# Patient Record
Sex: Female | Born: 1995
Health system: Southern US, Community
[De-identification: ages and names within clinical notes are randomized; demographics above are authoritative.]

## PROBLEM LIST (undated history)

## (undated) DIAGNOSIS — J45909 Unspecified asthma, uncomplicated: Secondary | ICD-10-CM

## (undated) DIAGNOSIS — L309 Dermatitis, unspecified: Secondary | ICD-10-CM

---

## 2014-12-13 ENCOUNTER — Encounter (HOSPITAL_COMMUNITY): Payer: Self-pay | Admitting: Family Medicine

## 2014-12-13 ENCOUNTER — Emergency Department (HOSPITAL_COMMUNITY)
Admission: EM | Admit: 2014-12-13 | Discharge: 2014-12-13 | Disposition: A | Discharge status: Home / Self Care | Payer: Self-pay | Attending: Emergency Medicine | Admitting: Emergency Medicine

## 2014-12-13 DIAGNOSIS — Z3202 Encounter for pregnancy test, result negative: Secondary | ICD-10-CM | POA: Insufficient documentation

## 2014-12-13 DIAGNOSIS — N390 Urinary tract infection, site not specified: Secondary | ICD-10-CM | POA: Insufficient documentation

## 2014-12-13 DIAGNOSIS — Z720 Tobacco use: Secondary | ICD-10-CM | POA: Insufficient documentation

## 2014-12-13 DIAGNOSIS — J45909 Unspecified asthma, uncomplicated: Secondary | ICD-10-CM | POA: Insufficient documentation

## 2014-12-13 HISTORY — DX: Unspecified asthma, uncomplicated: J45.909

## 2014-12-13 LAB — URINALYSIS, ROUTINE W REFLEX MICROSCOPIC
BILIRUBIN URINE: NEGATIVE
Glucose, UA: NEGATIVE mg/dL
Ketones, ur: NEGATIVE mg/dL
NITRITE: POSITIVE — AB
Protein, ur: 100 mg/dL — AB
SPECIFIC GRAVITY, URINE: 1.026 (ref 1.005–1.030)
UROBILINOGEN UA: 0.2 mg/dL (ref 0.0–1.0)
pH: 5.5 (ref 5.0–8.0)

## 2014-12-13 LAB — URINE MICROSCOPIC-ADD ON

## 2014-12-13 LAB — POC URINE PREG, ED: PREG TEST UR: NEGATIVE

## 2014-12-13 MED ORDER — NITROFURANTOIN MONOHYD MACRO 100 MG PO CAPS: 100.0000 mg | ORAL_CAPSULE | Freq: Two times a day (BID) | ORAL | Status: AC

## 2014-12-13 NOTE — ED Provider Notes (Signed)
CSN: [id number]     Arrival date & time 12/13/14  1048 History   First MD Initiated Contact with Patient 12/13/14 1129     Chief Complaint  Patient presents with  . Urinary Tract Infection     (Consider location/radiation/quality/duration/timing/severity/associated sxs/prior Treatment) HPI Comments: Pt is a 19 yo female who presents to the ED with complaint of dysuria, onset 2 days. Pt reports having dysuria and urinary frequency. She states she has noticed a small amount of blood in her urine. Denies fever, chills, HA, SOB, CP, abdominal pain, N/V/D, blood in stool, flank pain, vaginal bleeding, vaginal d/c. LMP 2 weeks ago. Pt reports she is sexually active but does not use any form of contraception. Denies hx of abdominal surgeries.    Past Medical History  Diagnosis Date  . Asthma    History reviewed. No pertinent past surgical history. History reviewed. No pertinent family history. Social History  Substance Use Topics  . Smoking status: Current Every Day Smoker  . Smokeless tobacco: None  . Alcohol Use: No   OB History    No data available     Review of Systems  Genitourinary: Positive for dysuria, frequency and hematuria.  All other systems reviewed and are negative.     Allergies  Review of patient's allergies indicates no known allergies.  Home Medications   Prior to Admission medications   Not on File   BP 141/80 mmHg  Pulse 89  Temp(Src) 98.3 F (36.8 C) (Oral)  Resp 20  SpO2 97%  LMP 12/01/2014 Physical Exam  Constitutional: She is oriented to person, place, and time. She appears well-developed and well-nourished. No distress.  HENT:  Head: Normocephalic and atraumatic.  Eyes: Conjunctivae and EOM are normal. Right eye exhibits no discharge. Left eye exhibits no discharge. No scleral icterus.  Neck: Normal range of motion. Neck supple.  Cardiovascular: Normal rate, regular rhythm, normal heart sounds and intact distal pulses.   Pulmonary/Chest:  Effort normal and breath sounds normal. No respiratory distress. She has no wheezes. She has no rales. She exhibits no tenderness.  Abdominal: Soft. Bowel sounds are normal. She exhibits no distension and no mass. There is no tenderness. There is no rebound, no guarding and no CVA tenderness.  Musculoskeletal: Normal range of motion. She exhibits no edema.  Neurological: She is alert and oriented to person, place, and time.  Skin: Skin is warm and dry.  Nursing note and vitals reviewed.   ED Course  Procedures (including critical care time) Labs Review Labs Reviewed  URINALYSIS, ROUTINE W REFLEX MICROSCOPIC (NOT AT [facility]) - Abnormal; Notable for the following:    APPearance CLOUDY (*)    Hgb urine dipstick LARGE (*)    Protein, ur 100 (*)    Nitrite POSITIVE (*)    Leukocytes, UA MODERATE (*)    All other components within normal limits  URINE MICROSCOPIC-ADD ON - Abnormal; Notable for the following:    Squamous Epithelial / LPF FEW (*)    Bacteria, UA MANY (*)    All other components within normal limits  POC URINE PREG, ED    Imaging Review No results found. I have personally reviewed and evaluated these images and lab results as part of my medical decision-making.  Filed Vitals:   12/13/14 1313  BP:   Pulse:   Temp: 97.9 F (36.6 C)  Resp:      MDM   Final diagnoses:  UTI (lower urinary tract infection)    Pt presents with  dysuria, urinary frequency for past 2 days. Reports noticing small amount of blood in urine. LMP 2 weeks ago. NO abdominal pain, N/V, fever, flank pain, vaginal bleeding, vaginal d/c. VSS. Exam unremarkable. UA consistent with UTI. Urine pregnancy negative. I do not feel that further workup or imaging is warranted at this time, abdominal exam benign. I do not suspect PID, kidney stones, pyelonephritis. Plan to d/c pt home with antibiotic. Pt given resource guide to follow up with PCP.   Evaluation does not show pathology requring ongoing emergent  intervention or admission. Pt is hemodynamically stable and mentating appropriately. Discussed findings/results and plan with patient/guardian, who agrees with plan. All questions answered. Return precautions discussed and outpatient follow up given.      Satira Sarkicole [place], [place]-C 12/13/14 1320  Blane OharaJoshua Zavitz, MD 12/14/14 1600

## 2014-12-13 NOTE — ED Notes (Signed)
Reviewed instructions with pt and departed in NAD.

## 2014-12-13 NOTE — ED Notes (Signed)
Pt here for urinary frequency and pain with urination.

## 2014-12-13 NOTE — Discharge Instructions (Signed)
Take your prescription for Macrobid twice a day for 5 days. Please follow up with a primary care provider from the Resource Guide provided below in 4-5 days. Please return to the Emergency Department if symptoms worsen or new onset of fever, chills, back pain, abdominal pain, vomiting, trouble urinating.   Emergency Department Resource Guide 1) Find a Doctor and Pay Out of Pocket Although you won't have to find out who is covered by your insurance plan, it is a good idea to ask around and get recommendations. You will then need to call the office and see if the doctor you have chosen will accept you as a new patient and what types of options they offer for patients who are self-pay. Some doctors offer discounts or will set up payment plans for their patients who do not have insurance, but you will need to ask so you aren't surprised when you get to your appointment.  2) Contact Your Local Health Department Not all health departments have doctors that can see patients for sick visits, but many do, so it is worth a call to see if yours does. If you don't know where your local health department is, you can check in your phone book. The CDC also has a tool to help you locate your state's health department, and many state websites also have listings of all of their local health departments.  3) Find a Walk-in Clinic If your illness is not likely to be very severe or complicated, you may want to try a walk in clinic. These are popping up all over the country in pharmacies, drugstores, and shopping centers. They're usually staffed by nurse practitioners or physician assistants that have been trained to treat common illnesses and complaints. They're usually fairly quick and inexpensive. However, if you have serious medical issues or chronic medical problems, these are probably not your best option.  No Primary Care Doctor: - Call Health Connect at  [phone] - they can help you locate a primary care doctor  that  accepts your insurance, provides certain services, etc. - Physician Referral Service- [phone]  Chronic Pain Problems: Organization         Address  Phone   Notes  [facility]  [phone] Patients need to be referred by their primary care doctor.   Medication Assistance: Organization         Address  Phone   Notes  [facility] Medication [facility] [address] [place] [place], [place] [address] [phone] --Must be a resident of [facility] -- Must have NO insurance coverage whatsoever (no Medicaid/ Medicare, etc.) -- The pt. MUST have a primary care doctor that directs their care regularly and follows them in the community   MedAssist  [phone]   [employer]  [phone]    Agencies that provide inexpensive medical care: Organization         Address  Phone   Notes  [facility]  [phone]   [facility]    [phone]   [facility] [address] [place], [place] [address] [phone]   [facility] [address] [place]. [address], [place] [phone]   Planned Parenthood    [phone]   [facility]    [phone]   [facility]  [address], [place] Phone:  [phone], Fax:  [phone] Hours of Operation:  9  am - 6 pm, M-F.  Also accepts Medicaid/Medicare and self-pay.  [facility]  [address], [place] Phone: [phone], Fax: [phone]) [phone]. Hours of Operation:  8:30 am - 5:30 pm, M-F.  Also accepts Medicaid and self-pay.  [facility] [place] [address], [place] Phone: [phone]) [phone]   [facility] [address], [place], [place] [phone], Ext. [phone] Mondays & Thursdays: 7-9 AM.  First 15 patients are seen on a first come, first serve basis.    Medicaid-accepting [facility] Providers:  Organization          Address  Phone   Notes  [facility] [address], [place] [phone] Also accepts self-pay patients.  [facility] [address], [place]  [phone]) [phone]   [facility] [address], [place] [phone]   [facility] [address] [place] [address], [place] [phone]) [phone]   Renaye RakersVeita Bland [address], [place]   [phone] Only accepts [place] Access [place] patients after they have their name applied to their card.   Self-Pay (no insurance) in [facility]:  Organization         Address  Phone   Notes  Sickle Cell Patients, [facility] [address], [place] [phone]   [facility] [address], [place] [phone]   [facility] [place]  [address], [place] [phone]   [facility]/Dr. Osei-Bonsu  [address] [place] [address], [place] or [phone], [place] [phone] Phone number for both [place] and [place] locations is the same.  [facility] [address], [place] [phone]) [phone]   [facility] [place] [address] [place] [address], [place] or [address] [phone]) [phone]   [facility] [address], [place] [phone]) [phone], phone; [phone], fax Sees patients 1st and 3rd Saturday of every month.  Must not qualify for public or private insurance (i.e. Medicaid, Medicare, [place] Health Choice, Veterans' Benefits)  Household income should be no more than 200% of the poverty level The clinic cannot treat you if you are pregnant or think you are pregnant  Sexually transmitted diseases are not treated at the clinic.    Dental Care: Organization         Address  Phone  Notes  [facility] Department of [facility] [address], [place] [phone] Accepts children up to age 20 who are enrolled in [place] or [place] Health Choice; pregnant women with a Medicaid card; and children who have applied for Medicaid or [place] Health Choice, but were declined, whose parents can pay a reduced fee at time of service.  [facility] Department of [facility] [place]  [address], [place] [phone]) [phone] Accepts children up to age 20 who are enrolled in [place] or [place] Health Choice; pregnant women with a Medicaid card; and children who have applied for Medicaid or [place] Health Choice, but were declined, whose parents can pay a reduced fee at time of service.  [facility]  [address], [place] [phone] Patients are seen by appointment only. Walk-ins are not accepted. [facility] will see patients 18 years of  age and older. Monday - Tuesday (8am-5pm) Most Wednesdays (8:30-5pm) $30 per visit, cash only  [facility] Adult Dental Access PROGRAM  [address], [facility] [phone] Patients are seen by appointment only. Walk-ins are not accepted. [place] will see patients 57 years of age and older. One Wednesday Evening (Monthly: Volunteer Based).  $30 per visit, cash only  [place]  [phone] for adults; Children under age 78, call Graduate Pediatric Dentistry at [phone]. Children aged 43-14, please call [phone] to request a pediatric application.  Dental services are provided in all areas of dental care including fillings, crowns and bridges, complete and partial dentures, implants, gum treatment, root canals, and extractions. Preventive care is also provided. Treatment is provided to both adults and children. Patients are selected via a lottery and there is often a waiting list.   [facility] [address] [place]  [phone] [email or web address]   [facility] [address] [place], [place]  [phone], Ext. [phone] Second and Fourth Thursday of each month, opens at 6:30 AM; Clinic ends at 9 AM.  Patients are seen on a first-come first-served basis, and a limited number are seen during each clinic.   [facility]  [address] Hillard Danker [place], [place] [phone]   Eligibility Requirements You must have lived in [place], [place], or [place] counties for at least the last three months.   You cannot be eligible for state or federal sponsored [employer], including [employer], [place], or [employer].   You generally cannot be eligible for healthcare insurance through your employer.    How to apply: Eligibility screenings are held every Tuesday and Wednesday afternoon from 1:00 pm until 4:00 pm. You do not need an appointment for the interview!  [facility] [address] [place], [place]   [place] Department  [place]  [phone]    Behavioral Health Resources in the Community: Intensive Outpatient Programs Organization         Address  Phone  Notes  [place]. [address], [place], [place] [phone]   [facility] Outpatient [address], [place], [place]   ADS: Alcohol & Drug Svcs [address] [place], [place]   [place] [address],  [place], [place] or [phone]   Substance Abuse Resources Organization         Address  Phone  Notes  Alcohol and Drug Services  [phone]   [place]  [phone]   The [place]   Chinita Pester  [phone]   Residential & Outpatient Substance Abuse Program  [phone]   Psychological Services Organization         Address  Phone  Notes  [facility] [place]  [place]  [phone]    [facility] [address] [place] or [phone]    Mobile Crisis Teams Organization         Address  Phone  Notes  [facility], Mobile Crisis Care Unit  [phone]   [facility]  [address] [place], [place]   [facility] [address] [place] [phone]    Self-Help/Support Groups Organization  Address  Phone             Notes  [facility] [place] - variety of support groups  [phone]- I7437963 Call for more information  Narcotics Anonymous (NA), Caring [facility] [address], [employer] Progress  2 meetings at this location   Statistician         Address  Phone  Notes  [facility] [address],    [place]  [phone]   [facility]  [address] [place] [address], [place], [place] [phone]   [facility] Treatment Facility [address] [place], [place] [phone] Admissions: 8am-3pm M-F  [facility] [address],    [place], [place] [phone]   [facility] [address] [place], [place], [place] [phone]   [facility] [address],  [place], [place] [phone]   [facility] - Intensive Outpatient [address], [place], [place] [phone]   [facility] ([facility] Recovery [facility] [address] [place].,  [place], [place] [phone] or [phone]   [facility] ([facility]) [address] [place], [place] [phone] Accepts Medicaid  [facility] [place] [address]  [place] [phone] Substance Abuse/Addiction Treatment   [facility] Organization         Address  Phone  Notes  [facility]  [phone]   Angie Fava, PhD [address] [place], [place]   [phone] or [phone]   [facility] Behavioral   [address] [place], [place] [phone]   [facility] [address], [place], [place] [phone] Insurance/Medicaid/sponsorship through [facility] Families [address]                                    [place], [place] [phone] Therapy/tele-psych/case  [facility] [address] [place], [place] [phone]    Dr. Lolly Mustache  [phone]   [facility] [place] [facility]  [employer] [facility] Dept. 1) [address] [place] 2) [address], [place] 3)  [address], [place] [phone]  [phone]   [facility] Child Abuse Hotline [phone] or [phone] (After Hours)

## 2015-07-22 ENCOUNTER — Emergency Department (HOSPITAL_BASED_OUTPATIENT_CLINIC_OR_DEPARTMENT_OTHER)
Admission: EM | Admit: 2015-07-22 | Discharge: 2015-07-22 | Disposition: A | Discharge status: Home / Self Care | Payer: Self-pay | Attending: Emergency Medicine | Admitting: Emergency Medicine

## 2015-07-22 ENCOUNTER — Encounter (HOSPITAL_BASED_OUTPATIENT_CLINIC_OR_DEPARTMENT_OTHER): Payer: Self-pay | Admitting: Emergency Medicine

## 2015-07-22 DIAGNOSIS — J4521 Mild intermittent asthma with (acute) exacerbation: Secondary | ICD-10-CM | POA: Insufficient documentation

## 2015-07-22 DIAGNOSIS — Z87891 Personal history of nicotine dependence: Secondary | ICD-10-CM | POA: Insufficient documentation

## 2015-07-22 MED ORDER — ALBUTEROL SULFATE HFA 108 (90 BASE) MCG/ACT IN AERS
2.0000 | INHALATION_SPRAY | Freq: Once | RESPIRATORY_TRACT | Status: AC
  Administered 2015-07-22: 2 via RESPIRATORY_TRACT
  Filled 2015-07-22: qty 6.7

## 2015-07-22 MED ORDER — DEXAMETHASONE 6 MG PO TABS
10.0000 mg | ORAL_TABLET | Freq: Once | ORAL | Status: AC
  Administered 2015-07-22: 10 mg via ORAL
  Filled 2015-07-22: qty 1

## 2015-07-22 MED ORDER — IPRATROPIUM-ALBUTEROL 0.5-2.5 (3) MG/3ML IN SOLN
3.0000 mL | Freq: Once | RESPIRATORY_TRACT | Status: AC
  Administered 2015-07-22: 3 mL via RESPIRATORY_TRACT

## 2015-07-22 MED ORDER — IPRATROPIUM-ALBUTEROL 0.5-2.5 (3) MG/3ML IN SOLN
RESPIRATORY_TRACT | Status: AC
  Filled 2015-07-22: qty 3

## 2015-07-22 NOTE — Discharge Instructions (Signed)
Asthma Attack Prevention While you may not be able to control the fact that you have asthma, you can take actions to prevent asthma attacks. The best way to prevent asthma attacks is to maintain good control of your asthma. You can achieve this by:  Taking your medicines as directed.  Avoiding things that can irritate your airways or make your asthma symptoms worse (asthma triggers).  Keeping track of how well your asthma is controlled and of any changes in your symptoms.  Responding quickly to worsening asthma symptoms (asthma attack).  Seeking emergency care when it is needed. WHAT ARE SOME WAYS TO PREVENT AN ASTHMA ATTACK? Have a Plan Work with your health care provider to create a written plan for managing and treating your asthma attacks (asthma action plan). This plan includes:  A list of your asthma triggers and how you can avoid them.  Information on when medicines should be taken and when their dosages should be changed.  The use of a device that measures how well your lungs are working (peak flow meter). Monitor Your Asthma Use your peak flow meter and record your results in a journal every day. A drop in your peak flow numbers on one or more days may indicate the start of an asthma attack. This can happen even before you start to feel symptoms. You can prevent an asthma attack from getting worse by following the steps in your asthma action plan. Avoid Asthma Triggers Work with your asthma health care provider to find out what your asthma triggers are. This can be done by:  Allergy testing.  Keeping a journal that notes when asthma attacks occur and the factors that may have contributed to them.  Determining if there are other medical conditions that are making your asthma worse. Once you have determined your asthma triggers, take steps to avoid them. This may include avoiding excessive or prolonged exposure to:  Dust. Have someone dust and vacuum your home for you once or  twice a week. Using a high-efficiency particulate arrestance (HEPA) vacuum is best.  Smoke. This includes campfire smoke, forest fire smoke, and secondhand smoke from tobacco products.  Pet dander. Avoid contact with animals that you know you are allergic to.  Allergens from trees, grasses or pollens. Avoid spending a lot of time outdoors when pollen counts are high, and on very windy days.  Very cold, dry, or humid air.  Mold.  Foods that contain high amounts of sulfites.  Strong odors.  Outdoor air pollutants, such as engine exhaust.  Indoor air pollutants, such as aerosol sprays and fumes from household cleaners.  Household pests, including dust mites and cockroaches, and pest droppings.  Certain medicines, including NSAIDs. Always talk to your health care provider before stopping or starting any new medicines. Medicines Take over-the-counter and prescription medicines only as told by your health care provider. Many asthma attacks can be prevented by carefully following your medicine schedule. Taking your medicines correctly is especially important when you cannot avoid certain asthma triggers. Act Quickly If an asthma attack does happen, acting quickly can decrease how severe it is and how long it lasts. Take these steps:   Pay attention to your symptoms. If you are coughing, wheezing, or having difficulty breathing, do not wait to see if your symptoms go away on their own. Follow your asthma action plan.  If you have followed your asthma action plan and your symptoms are not improving, call your health care provider or seek immediate medical care   at the nearest hospital. It is important to note how often you need to use your fast-acting rescue inhaler. If you are using your rescue inhaler more often, it may mean that your asthma is not under control. Adjusting your asthma treatment plan may help you to prevent future asthma attacks and help you to gain better control of your  condition. HOW CAN I PREVENT AN ASTHMA ATTACK WHEN I EXERCISE? Follow advice from your health care provider about whether you should use your fast-acting inhaler before exercising. Many people with asthma experience exercise-induced bronchoconstriction (EIB). This condition often worsens during vigorous exercise in cold, humid, or dry environments. Usually, people with EIB can stay very active by pre-treating with a fast-acting inhaler before exercising.   This information is not intended to replace advice given to you by your health care provider. Make sure you discuss any questions you have with your health care provider.   Document Released: 01/31/2009 Document Revised: 11/03/2014 Document Reviewed: 07/15/2014 Elsevier Interactive Patient Education 2016 Elsevier Inc.  

## 2015-07-22 NOTE — ED Provider Notes (Signed)
CSN: [id number]     Arrival date & time 07/22/15  1745 History   First MD Initiated Contact with Patient 07/22/15 2001   By signing my name below, I, Mesha Guinyard, attest that this documentation has been prepared under the direction and in the presence of Treatment Team:  Attending Provider: Lyndal Pulleyaniel Seyed Heffley, MD.  Electronically Signed: Arvilla MarketMesha Guinyard, Medical Scribe. 07/22/2015. 8:12 PM.  Chief Complaint  Patient presents with  . Asthma   The history is provided by the patient. No language interpreter was used.    HPI Comments: Lauren Bray is a 20 y.o. female who presents to the Emergency Department complaining of asthma onset an hour ago. Pt recalls she's out of her medication. She states that she is having associated symptoms of tightness in chest, SOB, and wheezing. Pt reports she's never been to the hospital for her asthma. She has PCP. She denies other upper respiratory symptoms, or any sick contacts.  Past Medical History  Diagnosis Date  . Asthma    History reviewed. No pertinent past surgical history. No family history on file. Social History  Substance Use Topics  . Smoking status: Former Smoker    Types: Cigars  . Smokeless tobacco: None  . Alcohol Use: No   OB History    No data available     Review of Systems  Respiratory: Positive for chest tightness, shortness of breath and wheezing.   All other systems reviewed and are negative.  Allergies  Review of patient's allergies indicates no known allergies.  Home Medications   Prior to Admission medications   Not on File   BP 143/93 mmHg  Pulse 74  Temp(Src) 98.2 F (36.8 C) (Oral)  Resp 18  Ht 5\' 3"  (1.6 m)  Wt 220 lb (99.791 kg)  BMI 38.98 kg/m2  SpO2 100%  LMP 07/22/2015 Physical Exam  Constitutional: She is oriented to person, place, and time. She appears well-developed and well-nourished. No distress.  HENT:  Head: Normocephalic and atraumatic.  Eyes: Conjunctivae are normal.  Neck: Neck supple.  No tracheal deviation present.  Cardiovascular: Normal rate, regular rhythm, normal heart sounds and intact distal pulses.  Exam reveals no gallop and no friction rub.   No murmur heard. Pulmonary/Chest: Effort normal and breath sounds normal. No respiratory distress. She has no wheezes. She has no rales.  Clear to auscultation bilaterally  Abdominal: Soft. She exhibits no distension.  Neurological: She is alert and oriented to person, place, and time.  Skin: Skin is warm and dry.  Psychiatric: She has a normal mood and affect. Her behavior is normal.  Nursing note and vitals reviewed.   ED Course  Procedures  DIAGNOSTIC STUDIES: Oxygen Saturation is 100% on RA, NL by my interpretation.    COORDINATION OF CARE: 8:12 PM Discussed treatment plan with pt at bedside and pt agreed to plan.  MDM   Final diagnoses:  Asthma with exacerbation, mild intermittent    Pt with wheezing that started today. Has history of prior reactive airway symptoms. Not septic appearing and no clinical evidence of pneumonia. Provided steroids and breathing optimized with treatments administered by RT. Demonstrated improvement in symptoms and appearance during ED course. Pt breathing comfortably on discharge. Plan to follow up with PCP as needed and return precautions discussed for worsening or new concerning symptoms.    I personally performed the services described in this documentation, which was scribed in my presence. The recorded information has been reviewed and is accurate.  Lyndal Pulley, MD 07/23/15 [phone]

## 2015-07-22 NOTE — ED Notes (Signed)
Pt with wheezing and cough since this morning. Pt is a/o at triage NAD

## 2017-01-03 ENCOUNTER — Other Ambulatory Visit: Payer: Self-pay

## 2017-01-03 ENCOUNTER — Encounter (HOSPITAL_BASED_OUTPATIENT_CLINIC_OR_DEPARTMENT_OTHER): Payer: Self-pay | Admitting: Emergency Medicine

## 2017-01-03 ENCOUNTER — Emergency Department (HOSPITAL_BASED_OUTPATIENT_CLINIC_OR_DEPARTMENT_OTHER)
Admission: EM | Admit: 2017-01-03 | Discharge: 2017-01-03 | Disposition: A | Discharge status: Home / Self Care | Payer: Self-pay | Attending: Emergency Medicine | Admitting: Emergency Medicine

## 2017-01-03 DIAGNOSIS — R21 Rash and other nonspecific skin eruption: Secondary | ICD-10-CM | POA: Insufficient documentation

## 2017-01-03 DIAGNOSIS — J45909 Unspecified asthma, uncomplicated: Secondary | ICD-10-CM | POA: Insufficient documentation

## 2017-01-03 DIAGNOSIS — L309 Dermatitis, unspecified: Secondary | ICD-10-CM | POA: Insufficient documentation

## 2017-01-03 DIAGNOSIS — L0390 Cellulitis, unspecified: Secondary | ICD-10-CM | POA: Insufficient documentation

## 2017-01-03 DIAGNOSIS — Z87891 Personal history of nicotine dependence: Secondary | ICD-10-CM | POA: Insufficient documentation

## 2017-01-03 DIAGNOSIS — L0100 Impetigo, unspecified: Secondary | ICD-10-CM | POA: Insufficient documentation

## 2017-01-03 HISTORY — DX: Dermatitis, unspecified: L30.9

## 2017-01-03 MED ORDER — TRIAMCINOLONE ACETONIDE 0.1 % EX CREA: 1.0000 "application " | TOPICAL_CREAM | Freq: Two times a day (BID) | CUTANEOUS | 0 refills | Status: DC

## 2017-01-03 MED ORDER — CEPHALEXIN 500 MG PO CAPS: 500.0000 mg | ORAL_CAPSULE | Freq: Four times a day (QID) | ORAL | 0 refills | Status: DC

## 2017-01-03 MED FILL — CEPHALEXIN 500 MG CAPSULE: 500 | 7 days supply | Qty: 28 | Fill #0

## 2017-01-03 MED FILL — TRIAMCINOLONE 0.1% CREAM: 0.1 | 30 days supply | Qty: 80 | Fill #0

## 2017-01-03 NOTE — Discharge Instructions (Signed)
Take antibiotics as directed. Please take all of your antibiotics until finished.  As we discussed, you need to take Benadryl and Zantac for the next 7 days.  If you do not want to take Benadryl you can also take Zyrtec.  This will help with itching.  You need to follow-up with your dermatologist.  Call and arrange for an appointment.  As we discussed, return to the emergency department for any worsening rash, drainage from the rash, fevers, difficulty breathing, swelling of her lips or tongue or any other worsening or concerning symptoms.

## 2017-01-03 NOTE — ED Provider Notes (Signed)
[facility] EMERGENCY DEPARTMENT Provider Note   CSN: [id number] Arrival date & time: 01/03/17  [id number]     History   Chief Complaint Chief Complaint  Patient presents with  . Urticaria    HPI Lauren Bray is a 21 y.o. female with PMH/o Asthma and Eczema who presents with generalized rash x 2 weeks.  Patient has a history of eczema and reports that she usually uses triamcinolone cream prescribed by her dermatologist.  Patient reports that approximately 2 and half weeks ago, she ran out of her triamcinolone cream and is not been able to follow-up with her dermatologist.  Patient reports that since then, the rash has become worse.  She reports that it is pruritic.  She reports that she has had some areas that have become very scaly and flaky.  She denies any drainage from those areas.  States that this is similar to previous episodes of eczema flare but states it is slightly worse than she has had previously.  Patient has been using her normal soaps and emollients that she uses with minimal improvement.  Patient called her dermatologist yesterday but was not able to get an appointment prompting ED visit today.  Patient denies any new soaps, detergents, perfumes, new drugs.  Patient denies any fevers, difficulty breathing, oral angioedema.  The history is provided by the patient.    Past Medical History:  Diagnosis Date  . Asthma   . Eczema     There are no active problems to display for this patient.   History reviewed. No pertinent surgical history.  OB History    No data available       Home Medications    Prior to Admission medications   Medication Sig Start Date End Date Taking? Authorizing Provider  cephALEXin (KEFLEX) 500 MG capsule Take 1 capsule (500 mg total) 4 (four) times daily by mouth. 01/03/17   Maxwell CaulLayden, Micholas Drumwright A, PA-C  triamcinolone cream (KENALOG) 0.1 % Apply 1 application 2 (two) times daily topically. 01/03/17   Maxwell CaulLayden, Madlyn Crosby A, PA-C    Family  History No family history on file.  Social History Social History   Tobacco Use  . Smoking status: Former Smoker    Types: Cigars  . Smokeless tobacco: Never Used  Substance Use Topics  . Alcohol use: No  . Drug use: No     Allergies   Patient has no known allergies.   Review of Systems Review of Systems  Constitutional: Negative for fever.  HENT: Negative for trouble swallowing.   Skin: Positive for rash.     Physical Exam Updated Vital Signs BP 133/84   Pulse 93   Temp 98.2 F (36.8 C) (Oral)   Resp 16   Ht 5\' 3"  (1.6 m)   Wt 104.3 kg (230 lb)   LMP 12/31/2016   SpO2 100%   BMI 40.74 kg/m   Physical Exam  Constitutional: She appears well-developed and well-nourished.  Sitting comfortably on examination table  HENT:  Head: Normocephalic and atraumatic.  No oral angioedema.  No rash noted to mucous membranes.  Oral lesions.  Eyes: Conjunctivae and EOM are normal. Right eye exhibits no discharge. Left eye exhibits no discharge. No scleral icterus.  Pulmonary/Chest: Effort normal. She has wheezes.  Very minimal wheezing to upper lung fields.  Neurological: She is alert.  Skin: Skin is warm and dry. Capillary refill takes less than 2 seconds. Rash noted.  Diffuse, scaly, erythematous rash noted to generalized body, worse in the  flexor creases of bilateral upper extremities and bilateral lower extremities.  She also has an area of diffuse scaly erythema across the anterior aspect of her chest.  There is some slight surrounding warmth and redness.  Scattered excoriations noted to bilateral upper and lower extremities.  No rash noted to palms or soles of feet.  Psychiatric: She has a normal mood and affect. Her speech is normal and behavior is normal.  Nursing note and vitals reviewed.    ED Treatments / Results  Labs (all labs ordered are listed, but only abnormal results are displayed) Labs Reviewed - No data to display  EKG  EKG Interpretation None         Radiology No results found.  Procedures Procedures (including critical care time)  Medications Ordered in ED Medications - No data to display   Initial Impression / Assessment and Plan / ED Course  I have reviewed the triage vital signs and the nursing notes.  Pertinent labs & imaging results that were available during my care of the patient were reviewed by me and considered in my medical decision making (see chart for details).     21 year old female who presents with 2 weeks of progressive worsening rash.  History of eczema and uses triamcinolone cream for symptoms.  Currently ran out of triamcinolone cream and has not been able to follow-up with dermatology.  No fevers, chills, oral angioedema. Patient is afebrile, non-toxic appearing, sitting comfortably on examination table. Vital signs reviewed and stable.  History/physical exam is consistent with generalized eczema.  There is a questionable area on the anterior chest that has some excoriations with overlying erythema and warmth.  Question if this is cellulitis versus impetigo.  Do not see any pustules or any crusted areas.  Plan to refill triamcinolone cream for symptomatic relief.  Check the patient on conservative therapies for pruritic relief.  We will also start patient on antibiotic to cover for potential cellulitis versus impetigo.  Instructed patient to follow-up with her dermatologist in the next 24-48 hours for further evaluation. Strict return precautions discussed. Patient expresses understanding and agreement to plan.    Final Clinical Impressions(s) / ED Diagnoses   Final diagnoses:  Eczema, unspecified type  Impetigo  Cellulitis, unspecified cellulitis site    ED Discharge Orders        Ordered    triamcinolone cream (KENALOG) 0.1 %  2 times daily     01/03/17 1030    cephALEXin (KEFLEX) 500 MG capsule  4 times daily     01/03/17 1030       Maxwell CaulLayden, Bradyn Vassey A, PA-C 01/03/17 1056    Alvira MondaySchlossman, Erin,  MD 01/05/17 [phone]

## 2017-01-03 NOTE — ED Triage Notes (Signed)
Pt c/o itching and dry skin d/t eczema; has been out of rx cream for months

## 2017-03-21 ENCOUNTER — Encounter (HOSPITAL_BASED_OUTPATIENT_CLINIC_OR_DEPARTMENT_OTHER): Payer: Self-pay | Admitting: *Deleted

## 2017-03-21 ENCOUNTER — Emergency Department (HOSPITAL_BASED_OUTPATIENT_CLINIC_OR_DEPARTMENT_OTHER)
Admission: EM | Admit: 2017-03-21 | Discharge: 2017-03-21 | Disposition: A | Discharge status: Home / Self Care | Payer: Self-pay | Attending: Emergency Medicine | Admitting: Emergency Medicine

## 2017-03-21 ENCOUNTER — Other Ambulatory Visit: Payer: Self-pay

## 2017-03-21 DIAGNOSIS — B379 Candidiasis, unspecified: Secondary | ICD-10-CM | POA: Insufficient documentation

## 2017-03-21 DIAGNOSIS — L309 Dermatitis, unspecified: Secondary | ICD-10-CM | POA: Insufficient documentation

## 2017-03-21 DIAGNOSIS — Z87891 Personal history of nicotine dependence: Secondary | ICD-10-CM | POA: Insufficient documentation

## 2017-03-21 DIAGNOSIS — Z79899 Other long term (current) drug therapy: Secondary | ICD-10-CM | POA: Insufficient documentation

## 2017-03-21 DIAGNOSIS — J45909 Unspecified asthma, uncomplicated: Secondary | ICD-10-CM | POA: Insufficient documentation

## 2017-03-21 MED ORDER — NYSTATIN 100000 UNIT/GM EX CREA: 1.0000 "application " | TOPICAL_CREAM | Freq: Two times a day (BID) | CUTANEOUS | 0 refills | Status: DC

## 2017-03-21 MED ORDER — PREDNISONE 20 MG PO TABS: 40.0000 mg | ORAL_TABLET | Freq: Every day | ORAL | 0 refills | Status: DC

## 2017-03-21 MED ORDER — TRIAMCINOLONE 0.1 % CREAM:EUCERIN CREAM 1:1: 1.0000 "application " | TOPICAL_CREAM | Freq: Two times a day (BID) | CUTANEOUS | 2 refills | Status: DC

## 2017-03-21 NOTE — ED Triage Notes (Signed)
Rash. States she has eczema and ran out of cream.

## 2017-03-21 NOTE — ED Provider Notes (Signed)
[facility] EMERGENCY DEPARTMENT Provider Note   CSN: [id number] Arrival date & time: 03/21/17  1732     History   Chief Complaint Chief Complaint  Patient presents with  . Rash    HPI Lauren Bray is a 22 y.o. female.  The history is provided by the patient.  Rash   This is a chronic problem. The current episode started 2 days ago. The problem has been rapidly worsening. Associated with: ran out of her triamcinolone cream  There has been no fever. Affected Location: diffuse except for face. The pain is at a severity of 0/10. The pain has been constant since onset. Associated symptoms include itching. She has tried nothing for the symptoms. The treatment provided no relief.    Past Medical History:  Diagnosis Date  . Asthma   . Eczema     There are no active problems to display for this patient.   History reviewed. No pertinent surgical history.  OB History    No data available       Home Medications    Prior to Admission medications   Medication Sig Start Date End Date Taking? Authorizing Provider  ALBUTEROL IN Inhale into the lungs.   Yes [provider]  cephALEXin (KEFLEX) 500 MG capsule Take 1 capsule (500 mg total) 4 (four) times daily by mouth. 01/03/17   Maxwell Caul, PA-C  triamcinolone cream (KENALOG) 0.1 % Apply 1 application 2 (two) times daily topically. 01/03/17   Maxwell Caul, PA-C    Family History No family history on file.  Social History Social History   Tobacco Use  . Smoking status: Former Smoker    Types: Cigars  . Smokeless tobacco: Never Used  Substance Use Topics  . Alcohol use: No  . Drug use: No     Allergies   Patient has no known allergies.   Review of Systems Review of Systems  Skin: Positive for itching and rash.  All other systems reviewed and are negative.    Physical Exam Updated Vital Signs BP 140/89 (BP Location: Right Arm)   Pulse 87   Temp 98.7 F (37.1 C) (Oral)    Resp 20   Ht 5\' 3"  (1.6 m)   Wt 104.3 kg (230 lb)   LMP 03/07/2017   SpO2 100%   BMI 40.74 kg/m   Physical Exam  Constitutional: She appears well-developed and well-nourished. No distress.  HENT:  Head: Normocephalic and atraumatic.  Eyes: Pupils are equal, round, and reactive to light.  Cardiovascular: Normal rate.  Pulmonary/Chest: Effort normal.  Skin: Rash noted.  Patchy raised skin lesions with excoriation and pigment changes diffusely over the arms, trunk and legs consistent with eczema.  No crusting, drainage or evidence of secondary infection at this time. Separate rash present under bilateral breasts with foul odor consistent with yeast skin is darkened and thickened in these areas.  Skin is moist.  Psychiatric: She has a normal mood and affect. Her behavior is normal.  Nursing note and vitals reviewed.    ED Treatments / Results  Labs (all labs ordered are listed, but only abnormal results are displayed) Labs Reviewed - No data to display  EKG  EKG Interpretation None       Radiology No results found.  Procedures Procedures (including critical care time)  Medications Ordered in ED Medications - No data to display   Initial Impression / Assessment and Plan / ED Course  I have reviewed the triage vital  signs and the nursing notes.  Pertinent labs & imaging results that were available during my care of the patient were reviewed by me and considered in my medical decision making (see chart for details).    Patient presenting with evidence of eczema exacerbation over her entire body most likely related to her running out of her triamcinolone cream 1 week ago.  There is no evidence of secondary infection.  Also patient has a candidal infection under her breasts.  Patient given triamcinolone and Eucerin cream as well as nystatin to go under her breasts.  Final Clinical Impressions(s) / ED Diagnoses   Final diagnoses:  Eczema, unspecified type  Candida  infection    ED Discharge Orders        Ordered    Triamcinolone Acetonide (TRIAMCINOLONE 0.1 % CREAM : EUCERIN) CREA  2 times daily     03/21/17 1952    nystatin cream (MYCOSTATIN)  2 times daily     03/21/17 1952    predniSONE (DELTASONE) 20 MG tablet  Daily     03/21/17 1952       Gwyneth SproutPlunkett, Chesni Vos, MD 03/21/17 1953

## 2017-08-13 ENCOUNTER — Encounter (HOSPITAL_BASED_OUTPATIENT_CLINIC_OR_DEPARTMENT_OTHER): Payer: Self-pay | Admitting: *Deleted

## 2017-08-13 ENCOUNTER — Emergency Department (HOSPITAL_BASED_OUTPATIENT_CLINIC_OR_DEPARTMENT_OTHER)
Admission: EM | Admit: 2017-08-13 | Discharge: 2017-08-13 | Disposition: A | Discharge status: Home / Self Care | Payer: Self-pay | Attending: Emergency Medicine | Admitting: Emergency Medicine

## 2017-08-13 ENCOUNTER — Emergency Department (HOSPITAL_BASED_OUTPATIENT_CLINIC_OR_DEPARTMENT_OTHER): Payer: Self-pay

## 2017-08-13 ENCOUNTER — Other Ambulatory Visit: Payer: Self-pay

## 2017-08-13 DIAGNOSIS — R05 Cough: Secondary | ICD-10-CM | POA: Insufficient documentation

## 2017-08-13 DIAGNOSIS — R072 Precordial pain: Secondary | ICD-10-CM | POA: Insufficient documentation

## 2017-08-13 DIAGNOSIS — J45909 Unspecified asthma, uncomplicated: Secondary | ICD-10-CM | POA: Insufficient documentation

## 2017-08-13 DIAGNOSIS — F17290 Nicotine dependence, other tobacco product, uncomplicated: Secondary | ICD-10-CM | POA: Insufficient documentation

## 2017-08-13 MED ORDER — IBUPROFEN 600 MG PO TABS: 600.0000 mg | ORAL_TABLET | Freq: Three times a day (TID) | ORAL | 0 refills | Status: AC | PRN

## 2017-08-13 MED ORDER — IBUPROFEN 200 MG PO TABS
600.0000 mg | ORAL_TABLET | Freq: Once | ORAL | Status: AC
  Administered 2017-08-13: 600 mg via ORAL
  Filled 2017-08-13: qty 1

## 2017-08-13 NOTE — ED Provider Notes (Signed)
[facility] EMERGENCY DEPARTMENT Provider Note   CSN: [id number] Arrival date & time: 08/13/17  [id number]     History   Chief Complaint Chief Complaint  Patient presents with  . Chest Pain    HPI Lauren Bray is a 22 y.o. female.  HPI Patient is a 22 year old female presents the emergency department with sharp left-sided chest pain over the past 3 days.  Initially was intermittent and now it is constant.  It is present with deep breathing.  It initially began after an episode of coughing.  She denies shortness of breath.  No history of DVT or pulmonary embolism.  She reports she did try some Tums last night with some improvement in her symptoms.  No history of gastroesophageal reflux disease.  She does have a history of asthma but states this feels different.  No shortness of breath.   Past Medical History:  Diagnosis Date  . Asthma   . Eczema     There are no active problems to display for this patient.   History reviewed. No pertinent surgical history.   OB History   None      Home Medications    Prior to Admission medications   Medication Sig Start Date End Date Taking? Authorizing Provider  ALBUTEROL IN Inhale into the lungs.   Yes [provider]  Triamcinolone Acetonide (TRIAMCINOLONE 0.1 % CREAM : EUCERIN) CREA Apply 1 application topically 2 (two) times daily. 03/21/17  Yes Plunkett, Alphonzo LemmingsWhitney, MD  cephALEXin (KEFLEX) 500 MG capsule Take 1 capsule (500 mg total) 4 (four) times daily by mouth. 01/03/17   Maxwell CaulLayden, Lindsey A, PA-C  nystatin cream (MYCOSTATIN) Apply 1 application topically 2 (two) times daily. 03/21/17   Gwyneth SproutPlunkett, Whitney, MD  predniSONE (DELTASONE) 20 MG tablet Take 2 tablets (40 mg total) by mouth daily. 03/21/17   Gwyneth SproutPlunkett, Whitney, MD  triamcinolone cream (KENALOG) 0.1 % Apply 1 application 2 (two) times daily topically. 01/03/17   Maxwell CaulLayden, Lindsey A, PA-C    Family History History reviewed. No pertinent family history.  Social  History Social History   Tobacco Use  . Smoking status: Current Every Day Smoker    Packs/day: 1.00    Types: Cigars  . Smokeless tobacco: Never Used  Substance Use Topics  . Alcohol use: No  . Drug use: No     Allergies   Orange juice [orange oil]   Review of Systems Review of Systems  All other systems reviewed and are negative.    Physical Exam Updated Vital Signs BP 127/81   Pulse 70   Temp 98.7 F (37.1 C) (Oral)   Resp 16   Ht 5\' 3"  (1.6 m)   Wt 92.1 kg (203 lb)   LMP 07/20/2017   SpO2 99%   BMI 35.96 kg/m   Physical Exam  Constitutional: She is oriented to person, place, and time. She appears well-developed and well-nourished. No distress.  HENT:  Head: Normocephalic and atraumatic.  Eyes: EOM are normal.  Neck: Normal range of motion.  Cardiovascular: Normal rate, regular rhythm and normal heart sounds.  Pulmonary/Chest: Effort normal and breath sounds normal.  Abdominal: Soft. She exhibits no distension. There is no tenderness.  Musculoskeletal: Normal range of motion.  Neurological: She is alert and oriented to person, place, and time.  Skin: Skin is warm and dry.  Psychiatric: She has a normal mood and affect. Judgment normal.  Nursing note and vitals reviewed.    ED Treatments / Results  Labs (all  labs ordered are listed, but only abnormal results are displayed) Labs Reviewed - No data to display  EKG EKG Interpretation  Date/Time:  Tuesday August 13 2017 09:37:34 EDT Ventricular Rate:  73 PR Interval:    QRS Duration: 91 QT Interval:  388 QTC Calculation: 428 R Axis:   59 Text Interpretation:  Sinus rhythm No old tracing to compare Confirmed by Azalia Bilis ([id number]) on 08/13/2017 9:42:41 AM   Radiology Dg Chest 2 View  Result Date: 08/13/2017 CLINICAL DATA:  Left upper chest pain EXAM: CHEST - 2 VIEW COMPARISON:  None. FINDINGS: Heart and mediastinal contours are within normal limits. No focal opacities or effusions. No acute bony  abnormality. IMPRESSION: No active cardiopulmonary disease. Electronically Signed   By: Charlett Nose M.D.   On: 08/13/2017 10:31    Procedures Procedures (including critical care time)  Medications Ordered in ED Medications - No data to display   Initial Impression / Assessment and Plan / ED Course  I have reviewed the triage vital signs and the nursing notes.  Pertinent labs & imaging results that were available during my care of the patient were reviewed by me and considered in my medical decision making (see chart for details).     Nonspecific chest pain.  May be musculoskeletal nature.  EKG without ischemic changes.  Chest x-ray pending  10:37 AM Overall well-appearing.  Chest x-ray without abnormality.  Patient be discharged home with instructions for anti-inflammatory treatment.  Primary care follow-up.  Patient understands return to the ER for new or worsening symptoms   Final Clinical Impressions(s) / ED Diagnoses   Final diagnoses:  None    ED Discharge Orders    None       Azalia Bilis, MD 08/13/17 1038

## 2017-08-13 NOTE — ED Triage Notes (Signed)
Chest pain started 3-4 days ago.  Mom gave Tums yesterday at 1230, chest pain went away and in the middle of the night it came back.

## 2017-08-13 NOTE — ED Notes (Signed)
ED Provider at bedside. 

## 2017-09-22 ENCOUNTER — Emergency Department (HOSPITAL_BASED_OUTPATIENT_CLINIC_OR_DEPARTMENT_OTHER)
Admission: EM | Admit: 2017-09-22 | Discharge: 2017-09-22 | Disposition: A | Discharge status: Home / Self Care | Payer: Medicaid Other | Attending: Emergency Medicine | Admitting: Emergency Medicine

## 2017-09-22 ENCOUNTER — Other Ambulatory Visit: Payer: Self-pay

## 2017-09-22 ENCOUNTER — Encounter (HOSPITAL_BASED_OUTPATIENT_CLINIC_OR_DEPARTMENT_OTHER): Payer: Self-pay | Admitting: *Deleted

## 2017-09-22 DIAGNOSIS — Z79899 Other long term (current) drug therapy: Secondary | ICD-10-CM | POA: Insufficient documentation

## 2017-09-22 DIAGNOSIS — K029 Dental caries, unspecified: Secondary | ICD-10-CM | POA: Insufficient documentation

## 2017-09-22 DIAGNOSIS — J45909 Unspecified asthma, uncomplicated: Secondary | ICD-10-CM | POA: Insufficient documentation

## 2017-09-22 DIAGNOSIS — F17290 Nicotine dependence, other tobacco product, uncomplicated: Secondary | ICD-10-CM | POA: Insufficient documentation

## 2017-09-22 DIAGNOSIS — K0889 Other specified disorders of teeth and supporting structures: Secondary | ICD-10-CM

## 2017-09-22 MED ORDER — KETOROLAC TROMETHAMINE 30 MG/ML IJ SOLN
30.0000 mg | Freq: Once | INTRAMUSCULAR | Status: AC
  Administered 2017-09-22: 30 mg via INTRAMUSCULAR
  Filled 2017-09-22: qty 1

## 2017-09-22 MED ORDER — NAPROXEN 500 MG PO TABS: 500.0000 mg | ORAL_TABLET | Freq: Two times a day (BID) | ORAL | 0 refills | Status: AC

## 2017-09-22 MED ORDER — PENICILLIN V POTASSIUM 500 MG PO TABS: 500.0000 mg | ORAL_TABLET | Freq: Four times a day (QID) | ORAL | 0 refills | Status: AC

## 2017-09-22 NOTE — ED Triage Notes (Signed)
Right upper tooth pain x 3-4 days

## 2017-09-22 NOTE — ED Provider Notes (Signed)
[facility] EMERGENCY DEPARTMENT Provider Note   CSN: [id number] Arrival date & time: 09/22/17  0040     History   Chief Complaint Chief Complaint  Patient presents with  . Dental Pain    HPI Lauren Bray is a 22 y.o. female.  HPI  This is a 22 year old female with a history of asthma who presents with tooth pain.  Patient reports 3 to 4-day history of worsening right upper dental pain.  She rates her pain 8 out of 10.  She has taken tramadol and Tylenol at home with minimal relief.  She denies any fevers, difficulty swallowing.  She does not have a dentist.  She denies any recent dental work.  Pain is worse with chewing.  It is throbbing in nature.  Past Medical History:  Diagnosis Date  . Asthma   . Eczema     There are no active problems to display for this patient.   History reviewed. No pertinent surgical history.   OB History   None      Home Medications    Prior to Admission medications   Medication Sig Start Date End Date Taking? Authorizing Provider  ALBUTEROL IN Inhale into the lungs.    [provider]  ibuprofen (ADVIL,MOTRIN) 600 MG tablet Take 1 tablet (600 mg total) by mouth every 8 (eight) hours as needed. 08/13/17   Azalia Bilisampos, Kevin, MD  naproxen (NAPROSYN) 500 MG tablet Take 1 tablet (500 mg total) by mouth 2 (two) times daily. 09/22/17   Horton, Mayer Maskerourtney F, MD  penicillin v potassium (VEETID) 500 MG tablet Take 1 tablet (500 mg total) by mouth 4 (four) times daily for 7 days. 09/22/17 09/29/17  Horton, Mayer Maskerourtney F, MD  Triamcinolone Acetonide (TRIAMCINOLONE 0.1 % CREAM : EUCERIN) CREA Apply 1 application topically 2 (two) times daily. 03/21/17   Gwyneth SproutPlunkett, Whitney, MD    Family History No family history on file.  Social History Social History   Tobacco Use  . Smoking status: Current Every Day Smoker    Packs/day: 1.00    Types: Cigars  . Smokeless tobacco: Never Used  Substance Use Topics  . Alcohol use: No  . Drug use: No       Allergies   Orange juice [orange oil]   Review of Systems Review of Systems  Constitutional: Negative for fever.  HENT: Positive for dental problem. Negative for trouble swallowing.   All other systems reviewed and are negative.    Physical Exam Updated Vital Signs BP (!) 171/108 (BP Location: Left Arm)   Pulse 76   Temp 98.4 F (36.9 C) (Oral)   Resp 16   LMP 09/08/2017   SpO2 100%   Physical Exam  Constitutional: She is oriented to person, place, and time. She appears well-developed and well-nourished.  Obese, no acute distress  HENT:  Head: Normocephalic and atraumatic.  Mouth/Throat: Uvula is midline, oropharynx is clear and moist and mucous membranes are normal. No oral lesions. No trismus in the jaw. Dental caries present. No dental abscesses. No oropharyngeal exudate.    Eyes: Pupils are equal, round, and reactive to light.  Neck: Neck supple.  Cardiovascular: Normal rate and regular rhythm.  Pulmonary/Chest: Effort normal. No respiratory distress.  Neurological: She is alert and oriented to person, place, and time.  Skin: Skin is warm and dry.  Psychiatric: She has a normal mood and affect.  Nursing note and vitals reviewed.    ED Treatments / Results  Labs (all labs ordered  are listed, but only abnormal results are displayed) Labs Reviewed - No data to display  EKG None  Radiology No results found.  Procedures Procedures (including critical care time)  Medications Ordered in ED Medications  ketorolac (TORADOL) 30 MG/ML injection 30 mg (has no administration in time range)     Initial Impression / Assessment and Plan / ED Course  I have reviewed the triage vital signs and the nursing notes.  Pertinent labs & imaging results that were available during my care of the patient were reviewed by me and considered in my medical decision making (see chart for details).     Patient presents with right upper dental pain.  She is overall  nontoxic-appearing on exam.  Vital signs notable for blood pressure 131/1 8.  Otherwise within normal limits.  No evidence of abscess.  Suspect underlying infection.  Not really amenable to dental block.  Recommend scheduled naproxen and penicillin.  Patient was provided with dental resources.  No evidence of deep space infection on exam.  After history, exam, and medical workup I feel the patient has been appropriately medically screened and is safe for discharge home. Pertinent diagnoses were discussed with the patient. Patient was given return precautions.   Final Clinical Impressions(s) / ED Diagnoses   Final diagnoses:  Pain, dental    ED Discharge Orders        Ordered    penicillin v potassium (VEETID) 500 MG tablet  4 times daily     09/22/17 0237    naproxen (NAPROSYN) 500 MG tablet  2 times daily     09/22/17 0237       Horton, Mayer Masker, MD 09/22/17 [phone]

## 2017-12-28 ENCOUNTER — Other Ambulatory Visit: Payer: Self-pay

## 2017-12-28 ENCOUNTER — Encounter (HOSPITAL_BASED_OUTPATIENT_CLINIC_OR_DEPARTMENT_OTHER): Payer: Self-pay | Admitting: Emergency Medicine

## 2017-12-28 ENCOUNTER — Emergency Department (HOSPITAL_BASED_OUTPATIENT_CLINIC_OR_DEPARTMENT_OTHER)
Admission: EM | Admit: 2017-12-28 | Discharge: 2017-12-28 | Disposition: A | Discharge status: Home / Self Care | Payer: Medicaid Other | Attending: Emergency Medicine | Admitting: Emergency Medicine

## 2017-12-28 DIAGNOSIS — J45909 Unspecified asthma, uncomplicated: Secondary | ICD-10-CM | POA: Insufficient documentation

## 2017-12-28 DIAGNOSIS — Z79899 Other long term (current) drug therapy: Secondary | ICD-10-CM | POA: Insufficient documentation

## 2017-12-28 DIAGNOSIS — F17210 Nicotine dependence, cigarettes, uncomplicated: Secondary | ICD-10-CM | POA: Insufficient documentation

## 2017-12-28 DIAGNOSIS — L309 Dermatitis, unspecified: Secondary | ICD-10-CM

## 2017-12-28 DIAGNOSIS — L259 Unspecified contact dermatitis, unspecified cause: Secondary | ICD-10-CM | POA: Insufficient documentation

## 2017-12-28 MED ORDER — TRIAMCINOLONE ACETONIDE 0.1 % EX CREA: 1.0000 "application " | TOPICAL_CREAM | Freq: Two times a day (BID) | CUTANEOUS | 0 refills | Status: DC

## 2017-12-28 NOTE — Discharge Instructions (Addendum)
It was our pleasure to provide your ER care today - we hope that you feel better.  Avoid any perfumed soaps/detergents.   Use eczema-type moisturizer such as Aveeno, CeraVe, or Eucerin.   You may try zyrtec or claritin as need for itching (available over the counter).   You may also use triamcinolone cream as need.   Follow up with primary care doctor in 1-2 weeks.

## 2017-12-28 NOTE — ED Provider Notes (Signed)
[facility] EMERGENCY DEPARTMENT Provider Note   CSN: [id number] Arrival date & time: 12/28/17  1027     History   Chief Complaint Chief Complaint  Patient presents with  . Rash    HPI Lauren Bray is a 22 y.o. female.  Patient indicates in past 1-2 weeks that her eczema has flared up, especially on upper extremities and back. Hx same. Symptoms gradual onset, persistent, constant, slowly worse. States out of triamcinolone cream and requests rx. Patient denies any new medication use. Denies any change in other home or personal products. Does not feel sick or ill. No fever or chills.   The history is provided by the patient.  Rash      Past Medical History:  Diagnosis Date  . Asthma   . Eczema     There are no active problems to display for this patient.   History reviewed. No pertinent surgical history.   OB History   None      Home Medications    Prior to Admission medications   Medication Sig Start Date End Date Taking? Authorizing Provider  ALBUTEROL IN Inhale into the lungs.    [provider]  ibuprofen (ADVIL,MOTRIN) 600 MG tablet Take 1 tablet (600 mg total) by mouth every 8 (eight) hours as needed. 08/13/17   Azalia Bilis, MD  naproxen (NAPROSYN) 500 MG tablet Take 1 tablet (500 mg total) by mouth 2 (two) times daily. 09/22/17   Horton, Mayer Masker, MD  Triamcinolone Acetonide (TRIAMCINOLONE 0.1 % CREAM : EUCERIN) CREA Apply 1 application topically 2 (two) times daily. 03/21/17   Gwyneth Sprout, MD    Family History No family history on file.  Social History Social History   Tobacco Use  . Smoking status: Current Every Day Smoker    Packs/day: 1.00    Types: Cigars  . Smokeless tobacco: Never Used  Substance Use Topics  . Alcohol use: No  . Drug use: No     Allergies   Orange juice [orange oil]   Review of Systems Review of Systems  Constitutional: Negative for fever.  HENT: Negative for sore throat.     Respiratory: Negative for cough.   Skin: Positive for rash.     Physical Exam Updated Vital Signs BP 131/76 (BP Location: Left Arm)   Pulse 89   Temp 98.2 F (36.8 C) (Oral)   Resp 16   Ht 1.6 m (5\' 3" )   Wt 97.5 kg   LMP 12/18/2017   SpO2 100%   BMI 38.09 kg/m   Physical Exam  Constitutional: She appears well-developed and well-nourished.  HENT:  Mouth/Throat: Oropharynx is clear and moist.  Eyes: Conjunctivae are normal. No scleral icterus.  Neck: Neck supple. No tracheal deviation present.  Cardiovascular: Normal rate, regular rhythm, normal heart sounds and intact distal pulses.  Pulmonary/Chest: Effort normal and breath sounds normal. No respiratory distress.  Abdominal: Normal appearance.  Musculoskeletal: She exhibits no edema.  Neurological: She is alert.  Skin: Skin is warm and dry.  Dermatitis c/w eczema, esp on elbows, upper back, arms.   Psychiatric: She has a normal mood and affect.  Nursing note and vitals reviewed.    ED Treatments / Results  Labs (all labs ordered are listed, but only abnormal results are displayed) Labs Reviewed - No data to display  EKG None  Radiology No results found.  Procedures Procedures (including critical care time)  Medications Ordered in ED Medications - No data to display  Initial Impression / Assessment and Plan / ED Course  I have reviewed the triage vital signs and the nursing notes.  Pertinent labs & imaging results that were available during my care of the patient were reviewed by me and considered in my medical decision making (see chart for details).  Pt requests new rx.   Reviewed nursing notes and prior charts for additional history.   Also rec claritin or zyrtec for itching, moisturizer therapy.  rec pcp f/u.  Pt appears stable for d/c.     Final Clinical Impressions(s) / ED Diagnoses   Final diagnoses:  None    ED Discharge Orders    None       Cathren Laine, MD 12/28/17  1103

## 2017-12-28 NOTE — ED Triage Notes (Signed)
Pt c/o "eczema flare" x 1 week.

## 2018-01-09 ENCOUNTER — Encounter (HOSPITAL_BASED_OUTPATIENT_CLINIC_OR_DEPARTMENT_OTHER): Payer: Self-pay | Admitting: *Deleted

## 2018-01-09 ENCOUNTER — Emergency Department (HOSPITAL_BASED_OUTPATIENT_CLINIC_OR_DEPARTMENT_OTHER)
Admission: EM | Admit: 2018-01-09 | Discharge: 2018-01-09 | Disposition: A | Discharge status: Home / Self Care | Payer: Medicaid Other | Attending: Emergency Medicine | Admitting: Emergency Medicine

## 2018-01-09 DIAGNOSIS — J45909 Unspecified asthma, uncomplicated: Secondary | ICD-10-CM | POA: Insufficient documentation

## 2018-01-09 DIAGNOSIS — L309 Dermatitis, unspecified: Secondary | ICD-10-CM | POA: Insufficient documentation

## 2018-01-09 DIAGNOSIS — Z79899 Other long term (current) drug therapy: Secondary | ICD-10-CM | POA: Insufficient documentation

## 2018-01-09 DIAGNOSIS — F17290 Nicotine dependence, other tobacco product, uncomplicated: Secondary | ICD-10-CM | POA: Insufficient documentation

## 2018-01-09 MED ORDER — TRIAMCINOLONE ACETONIDE 0.1 % EX CREA: 1.0000 "application " | TOPICAL_CREAM | Freq: Two times a day (BID) | CUTANEOUS | 0 refills | Status: DC

## 2018-01-09 MED FILL — TRIAMCINOLONE 0.1% CREAM: 0.1 | 30 days supply | Qty: 454 | Fill #0

## 2018-01-09 NOTE — Discharge Instructions (Signed)
Apply Triamcinolone to the body. Only use Hydrocortisone 1% on the face

## 2018-01-09 NOTE — ED Notes (Signed)
Patient states her rash is due to running out of triamcinolone cream.  Requesting large jar of this.

## 2018-01-09 NOTE — ED Provider Notes (Signed)
[facility] EMERGENCY DEPARTMENT Provider Note   CSN: [id number] Arrival date & time: 01/09/18  1149     History   Chief Complaint Chief Complaint  Patient presents with  . Rash    HPI Lauren Bray is a 22 y.o. female who presents for medication refill.  Past medical history for eczema and asthma.  She states that she was seen here couple weeks ago and was given 2 small tubes of triamcinolone.  She states that this was not enough because her eczema is over her entire body.  It is very itchy and she has been scratching the point where she is creating wounds.  She does not have insurance right now and recently lost her job.  She does not see a dermatologist regularly.  No fevers.  HPI  Past Medical History:  Diagnosis Date  . Asthma   . Eczema     There are no active problems to display for this patient.   History reviewed. No pertinent surgical history.   OB History   None      Home Medications    Prior to Admission medications   Medication Sig Start Date End Date Taking? Authorizing Provider  ALBUTEROL IN Inhale into the lungs.    [provider]  ibuprofen (ADVIL,MOTRIN) 600 MG tablet Take 1 tablet (600 mg total) by mouth every 8 (eight) hours as needed. 08/13/17   Azalia Bilisampos, Kevin, MD  naproxen (NAPROSYN) 500 MG tablet Take 1 tablet (500 mg total) by mouth 2 (two) times daily. 09/22/17   Horton, Mayer Maskerourtney F, MD  Triamcinolone Acetonide (TRIAMCINOLONE 0.1 % CREAM : EUCERIN) CREA Apply 1 application topically 2 (two) times daily. 03/21/17   Gwyneth SproutPlunkett, Whitney, MD  triamcinolone cream (KENALOG) 0.1 % Apply 1 application topically 2 (two) times daily. 12/28/17   Cathren LaineSteinl, Kevin, MD    Family History No family history on file.  Social History Social History   Tobacco Use  . Smoking status: Current Every Day Smoker    Packs/day: 1.00    Types: Cigars  . Smokeless tobacco: Never Used  Substance Use Topics  . Alcohol use: No  . Drug use: No      Allergies   Orange juice [orange oil]   Review of Systems Review of Systems  Constitutional: Negative for fever.  Skin: Positive for rash.     Physical Exam Updated Vital Signs BP 127/82 (BP Location: Left Arm)   Pulse 88   Temp 98.1 F (36.7 C) (Oral)   Resp 16   Ht 5\' 3"  (1.6 m)   Wt 97.5 kg   LMP 12/18/2017   SpO2 100%   BMI 38.08 kg/m   Physical Exam  Constitutional: She is oriented to person, place, and time. She appears well-developed and well-nourished. No distress.  HENT:  Head: Normocephalic and atraumatic.  Dry perioral rash  Eyes: Pupils are equal, round, and reactive to light. Conjunctivae are normal. Right eye exhibits no discharge. Left eye exhibits no discharge. No scleral icterus.  Neck: Normal range of motion.  Cardiovascular: Normal rate.  Pulmonary/Chest: Effort normal. No respiratory distress.  Abdominal: She exhibits no distension.  Neurological: She is alert and oriented to person, place, and time.  Skin: Skin is warm and dry. Rash (Erythematous dry generalized rash on the trunk and extremities) noted.  Psychiatric: She has a normal mood and affect. Her behavior is normal.  Nursing note and vitals reviewed.    ED Treatments / Results  Labs (all labs ordered  are listed, but only abnormal results are displayed) Labs Reviewed - No data to display  EKG None  Radiology No results found.  Procedures Procedures (including critical care time)  Medications Ordered in ED Medications - No data to display   Initial Impression / Assessment and Plan / ED Course  I have reviewed the triage vital signs and the nursing notes.  Pertinent labs & imaging results that were available during my care of the patient were reviewed by me and considered in my medical decision making (see chart for details).  22 year old female with eczema presents for medication refill.  We will prescribe a jar of triamcinolone.  She was advised to only use  hydrocortisone on her face.  She is encouraged to follow-up with a dermatologist when possible.  Final Clinical Impressions(s) / ED Diagnoses   Final diagnoses:  Eczema, unspecified type    ED Discharge Orders    None       Bethel Born, PA-C 01/09/18 1313    Tilden Fossa, MD 01/10/18 [phone]

## 2018-01-09 NOTE — ED Triage Notes (Signed)
Eczema exacerbation. She ran out of her Triamcinolone cream.

## 2018-04-20 ENCOUNTER — Other Ambulatory Visit: Payer: Self-pay

## 2018-04-20 ENCOUNTER — Encounter (HOSPITAL_BASED_OUTPATIENT_CLINIC_OR_DEPARTMENT_OTHER): Payer: Self-pay | Admitting: Emergency Medicine

## 2018-04-20 ENCOUNTER — Emergency Department (HOSPITAL_BASED_OUTPATIENT_CLINIC_OR_DEPARTMENT_OTHER)
Admission: EM | Admit: 2018-04-20 | Discharge: 2018-04-20 | Disposition: A | Discharge status: Home / Self Care | Payer: Medicaid Other | Attending: Emergency Medicine | Admitting: Emergency Medicine

## 2018-04-20 DIAGNOSIS — J45909 Unspecified asthma, uncomplicated: Secondary | ICD-10-CM | POA: Insufficient documentation

## 2018-04-20 DIAGNOSIS — Z79899 Other long term (current) drug therapy: Secondary | ICD-10-CM | POA: Insufficient documentation

## 2018-04-20 DIAGNOSIS — F17210 Nicotine dependence, cigarettes, uncomplicated: Secondary | ICD-10-CM | POA: Insufficient documentation

## 2018-04-20 DIAGNOSIS — H00012 Hordeolum externum right lower eyelid: Secondary | ICD-10-CM | POA: Insufficient documentation

## 2018-04-20 MED ORDER — TETRACAINE HCL 0.5 % OP SOLN
1.0000 [drp] | Freq: Once | OPHTHALMIC | Status: AC
  Administered 2018-04-20: 1 [drp] via OPHTHALMIC
  Filled 2018-04-20: qty 4

## 2018-04-20 MED ORDER — ERYTHROMYCIN 5 MG/GM OP OINT: TOPICAL_OINTMENT | OPHTHALMIC | 0 refills | Status: DC

## 2018-04-20 MED ORDER — FLUORESCEIN SODIUM 1 MG OP STRP
1.0000 | ORAL_STRIP | Freq: Once | OPHTHALMIC | Status: AC
  Administered 2018-04-20: 1 via OPHTHALMIC
  Filled 2018-04-20: qty 1

## 2018-04-20 NOTE — ED Triage Notes (Signed)
Patient states that she has had intermittent facial swelling to her ber bilateral eyes for the last 2 -3 days - she also reports that she is having a rash. Patient denies any known allergies

## 2018-04-20 NOTE — Discharge Instructions (Signed)
Tinea applying warm compresses.  Use antibiotic ointment as directed.  Follow-up with referred eye doctor if symptoms continue.  Return the emergency department for any worsening eyelid redness, swelling, fevers or any other worsening concerning symptoms.

## 2018-04-20 NOTE — ED Provider Notes (Signed)
[facility] EMERGENC[facility] DEPARTMENT Provider Note   CSN: [id number] Arrival date & time: 04/20/18  1349    History   Chief Complaint Chief Complaint  Patient presents with  . Eye Drainage    HPI Lauren Bray is a 23 y.o. female past medical 3 of asthma, eczema who presents for evaluation of 3 days of bilateral eye redness, irritation, eyelid swelling.  Patient also reports she has had styes.  He is initially she had a stye in the left eye tooth that drained yesterday.  She reports that when she is here developed a stye in the right eye.  She reports that they are irritating.  She states that she has not tried any medications.  She has applied teabags and warm compresses.  She has not had any vision changes.  She does not wear glasses or contacts.  She denies any trauma.  Patient states she has not had any fevers.  Patient denies any history of allergies.    The history is provided by the patient.    Past Medical History:  Diagnosis Date  . Asthma   . Eczema     There are no active problems to display for this patient.   History reviewed. No pertinent surgical history.   OB History   No obstetric history on file.      Home Medications    Prior to Admission medications   Medication Sig Start Date End Date Taking? Authorizing Provider  ALBUTEROL IN Inhale into the lungs.    [provider]  erythromycin ophthalmic ointment Place a 1/2 inch ribbon of ointment into the lower eyelid x4 days for 1 week. 04/20/18   Maxwell Caul, PA-C  ibuprofen (ADVIL,MOTRIN) 600 MG tablet Take 1 tablet (600 mg total) by mouth every 8 (eight) hours as needed. 08/13/17   Azalia Bilis, MD  naproxen (NAPROSYN) 500 MG tablet Take 1 tablet (500 mg total) by mouth 2 (two) times daily. 09/22/17   Horton, Mayer Masker, MD  Triamcinolone Acetonide (TRIAMCINOLONE 0.1 % CREAM : EUCERIN) CREA Apply 1 application topically 2 (two) times daily. 03/21/17   Gwyneth Sprout, MD    triamcinolone cream (KENALOG) 0.1 % Apply 1 application topically 2 (two) times daily. 01/09/18   Bethel Born, PA-C    Family History History reviewed. No pertinent family history.  Social History Social History   Tobacco Use  . Smoking status: Current Every Day Smoker    Packs/day: 1.00    Types: Cigars  . Smokeless tobacco: Never Used  Substance Use Topics  . Alcohol use: No  . Drug use: No     Allergies   Orange juice [orange oil]   Review of Systems Review of Systems  Constitutional: Negative for fever.  Eyes: Positive for pain, discharge and redness. Negative for visual disturbance.  All other systems reviewed and are negative.    Physical Exam Updated Vital Signs BP 140/89 (BP Location: Left Arm)   Pulse 76   Temp 98.1 F (36.7 C) (Oral)   Resp 18   Wt 97.5 kg   LMP 04/06/2018   SpO2 100%   BMI 38.08 kg/m   Physical Exam Vitals signs and nursing note reviewed.  Constitutional:      Appearance: She is well-developed.  HENT:     Head: Normocephalic and atraumatic.  Eyes:     General: No scleral icterus.       Right eye: Discharge and hordeolum present.  Left eye: No discharge.     Extraocular Movements: Extraocular movements intact.     Right eye: Normal extraocular motion.     Left eye: Normal extraocular motion.     Conjunctiva/sclera:     Right eye: Right conjunctiva is injected.     Comments: Slight conjunctival injection noted on the right eye.  There is a stye noted on the right lower eyelid.  She is got diffuse erythema edema noted to bilateral eyelids with also some scaly skin changes noted on the eyelids.  No surrounding periorbital edema, erythema.  EOMs intact without any difficulty. PERRL.   Pulmonary:     Effort: Pulmonary effort is normal.  Skin:    General: Skin is warm and dry.  Neurological:     Mental Status: She is alert.  Psychiatric:        Speech: Speech normal.        Behavior: Behavior normal.      ED  Treatments / Results  Labs (all labs ordered are listed, but only abnormal results are displayed) Labs Reviewed - No data to display  EKG None  Radiology No results found.  Procedures Procedures (including critical care time)  Medications Ordered in ED Medications  tetracaine (PONTOCAINE) 0.5 % ophthalmic solution 1 drop (1 drop Both Eyes Given by Other 04/20/18 1604)  fluorescein ophthalmic strip 1 strip (1 strip Both Eyes Given by Other 04/20/18 1604)     Initial Impression / Assessment and Plan / ED Course  I have reviewed the triage vital signs and the nursing notes.  Pertinent labs & imaging results that were available during my care of the patient were reviewed by me and considered in my medical decision making (see chart for details).        23 year old female who presents for evaluation of 3 days of bilateral eye irritation.  She reports that for last week, she has had styes.  First in the left eye and then that switched to the right eye.  She states that the eyelids are irritating.  No fevers. Patient is afebrile, non-toxic appearing, sitting comfortably on examination table. Vital signs reviewed and stable.  Sam, she has a stye noted in the right lower eyelid.  No surrounding periorbital edema, erythema.  She has some diffuse eyelid edema, erythema.  Consider blepharitis versus conjunctivitis versus stye.  History/physical exam not concerning for preseptal cellulitis or orbital cellulitis.  Doubt corneal abrasion given lack of trauma, lack of glasses/contacts.  Will plan for visual acuity, check Woods lamp evaluation, IOP's.  Evaluation with Joseph Art lamp shows no evidence of fluorescein uptake, corneal abrasion.  No evidence of dendritic lesion.  No Seidel sign.  Intraocular pressure as documented below:  Left IOP: 19, 20 Right IOP: 18, 20     Visual Acuity  Right Eye Distance: 20/20 Left Eye Distance: 20/20 Bilateral Distance: 20/20  Consider blepharitis versus  conjunctivitis versus stye.  Since is been going on for a week, will plan to give topical antibiotic treatment.  Additionally, will give outpatient ophthalmology referral for further evaluation. At this time, patient exhibits no emergent life-threatening condition that require further evaluation in ED. Patient had ample opportunity for questions and discussion. All patient's questions were answered with full understanding. Strict return precautions discussed. Patient expresses understanding and agreement to plan.  Portions of this note were generated with Scientist, clinical (histocompatibility and immunogenetics). Dictation errors may occur despite best attempts at proofreading.   Final Clinical Impressions(s) / ED Diagnoses   Final diagnoses:  Hordeolum externum of right lower eyelid    ED Discharge Orders         Ordered    erythromycin ophthalmic ointment     04/20/18 1600           Rosana Hoes 04/21/18 0012    Benjiman Core, MD 04/21/18 0730

## 2018-05-15 ENCOUNTER — Emergency Department (HOSPITAL_BASED_OUTPATIENT_CLINIC_OR_DEPARTMENT_OTHER)
Admission: EM | Admit: 2018-05-15 | Discharge: 2018-05-15 | Disposition: A | Discharge status: Home / Self Care | Payer: Medicaid Other | Attending: Emergency Medicine | Admitting: Emergency Medicine

## 2018-05-15 ENCOUNTER — Other Ambulatory Visit: Payer: Self-pay

## 2018-05-15 ENCOUNTER — Encounter (HOSPITAL_BASED_OUTPATIENT_CLINIC_OR_DEPARTMENT_OTHER): Payer: Self-pay | Admitting: Emergency Medicine

## 2018-05-15 DIAGNOSIS — J45909 Unspecified asthma, uncomplicated: Secondary | ICD-10-CM | POA: Insufficient documentation

## 2018-05-15 DIAGNOSIS — F17290 Nicotine dependence, other tobacco product, uncomplicated: Secondary | ICD-10-CM | POA: Insufficient documentation

## 2018-05-15 DIAGNOSIS — L309 Dermatitis, unspecified: Secondary | ICD-10-CM | POA: Insufficient documentation

## 2018-05-15 MED ORDER — TRIAMCINOLONE 0.1 % CREAM:EUCERIN CREAM 1:1: 1.0000 "application " | TOPICAL_CREAM | Freq: Two times a day (BID) | CUTANEOUS | 2 refills | Status: DC

## 2018-05-15 MED FILL — CMP-TAC.1%CR/EUCERINCR 1:1: 15 days supply | Qty: 240 | Fill #0

## 2018-05-15 NOTE — Discharge Instructions (Signed)
You were evaluated in the Emergency Department and after careful evaluation, we did not find any emergent condition requiring admission or further testing in the hospital.  Your symptoms today seem to be due to an eczema flare.  Use the triamcinolone cream as discussed.  Please limit use on the face only for the next 3 to 5 days.  Please call the [facility] [place] [facility] for a follow-up appointment.  They can be reached at [phone]  Please return to the Emergency Department if you experience any worsening of your condition.  We encourage you to follow up with a primary care provider.  Thank you for allowing [place] to be a part of your care.

## 2018-05-15 NOTE — ED Notes (Signed)
ED Provider at bedside. 

## 2018-05-15 NOTE — ED Provider Notes (Signed)
[facility] Emergency Department Provider Note MRN:  [id number]  Arrival date & time: 05/15/18     Chief Complaint   Eczema   History of Present Illness   Lauren Bray is a 23 y.o. year-old female with a history of eczema presenting to the ED with chief complaint of eczema.  Patient explains that she ran out of her triamcinolone cream about 4 days ago, and since that time has began having a flare of her eczema.  Scaly rash to her hands, arms, back, face.  Starting to have skin breakdown to her face which is causing her distress.  Denies fever, no new medications, no chest pain, no shortness of breath, no vomiting, no diarrhea, no recent travel.  No abdominal pain.  Wants a refill of her triamcinolone cream and another cream to help her face.  Review of Systems  A complete 10 system review of systems was obtained and all systems are negative except as noted in the HPI and PMH.   Patient's Health History    Past Medical History:  Diagnosis Date  . Asthma   . Eczema     History reviewed. No pertinent surgical history.  History reviewed. No pertinent family history.  Social History   Socioeconomic History  . Marital status: Single    Spouse name: Not on file  . Number of children: Not on file  . Years of education: Not on file  . Highest education level: Not on file  Occupational History  . Not on file  Social Needs  . Financial resource strain: Not on file  . Food insecurity:    Worry: Not on file    Inability: Not on file  . Transportation needs:    Medical: Not on file    Non-medical: Not on file  Tobacco Use  . Smoking status: Current Every Day Smoker    Packs/day: 1.00    Types: Cigars  . Smokeless tobacco: Never Used  Substance and Sexual Activity  . Alcohol use: No  . Drug use: No  . Sexual activity: Not Currently  Lifestyle  . Physical activity:    Days per week: Not on file    Minutes per session: Not on file  . Stress: Not  on file  Relationships  . Social connections:    Talks on phone: Not on file    Gets together: Not on file    Attends religious service: Not on file    Active member of club or organization: Not on file    Attends meetings of clubs or organizations: Not on file    Relationship status: Not on file  . Intimate partner violence:    Fear of current or ex partner: Not on file    Emotionally abused: Not on file    Physically abused: Not on file    Forced sexual activity: Not on file  Other Topics Concern  . Not on file  Social History Narrative  . Not on file     Physical Exam  Vital Signs and Nursing Notes reviewed Vitals:   05/15/18 0835  BP: 121/69  Pulse: 85  Resp: 18  Temp: 98 F (36.7 C)  SpO2: 100%    CONSTITUTIONAL: Well-appearing, NAD NEURO:  Alert and oriented x 3, no focal deficits EYES:  eyes equal and reactive ENT/NECK:  no LAD, no JVD CARDIO: Regular rate, well-perfused, normal S1 and S2 PULM:  CTAB no wheezing or rhonchi GI/GU:  normal bowel sounds, non-distended, non-tender  MSK/SPINE:  No gross deformities, no edema SKIN: Dry rash to the dorsal aspect of the hands, back, also with scattered hypopigmented rash to the face with excoriation PSYCH:  Appropriate speech and behavior  Diagnostic and Interventional Summary    Labs Reviewed - No data to display  No orders to display    Medications - No data to display   Procedures Critical Care  ED Course and Medical Decision Making  I have reviewed the triage vital signs and the nursing notes.  Pertinent labs & imaging results that were available during my care of the patient were reviewed by me and considered in my medical decision making (see below for details).  Consistent with eczema flare in the setting of medication noncompliance.  Patient has been told not to use her high potency triamcinolone cream on her face, but has tried 1% hydrocortisone cream without any help for the past 3 days.  According to  the literature and, in the setting a short course of high potency cream is appropriate for the face.  Recommend limiting triamcinolone to the facial region for only 3 to 5 days and recommending dermatology follow-up.  After the discussed management above, the patient was determined to be safe for discharge.  The patient was in agreement with this plan and all questions regarding their care were answered.  ED return precautions were discussed and the patient will return to the ED with any significant worsening of condition.  Elmer Sow. Pilar Plate, MD [facility] Emergency Medicine [facility] [email or web address]  Final Clinical Impressions(s) / ED Diagnoses     ICD-10-CM   1. Eczema, unspecified type L30.9     ED Discharge Orders         Ordered    Triamcinolone Acetonide (TRIAMCINOLONE 0.1 % CREAM : EUCERIN) CREA  2 times daily     05/15/18 0909             Sabas Sous, MD 05/15/18 [phone]

## 2018-05-15 NOTE — ED Triage Notes (Signed)
Reports eczema flare up to arms, back, and legs.  States she ran out of her triamcinolone cream.  Requesting more.  Additionally states she needs cream for her face.

## 2018-07-22 ENCOUNTER — Emergency Department (HOSPITAL_BASED_OUTPATIENT_CLINIC_OR_DEPARTMENT_OTHER)
Admission: EM | Admit: 2018-07-22 | Discharge: 2018-07-22 | Disposition: A | Discharge status: Home / Self Care | Payer: Medicaid Other | Attending: Emergency Medicine | Admitting: Emergency Medicine

## 2018-07-22 ENCOUNTER — Encounter (HOSPITAL_BASED_OUTPATIENT_CLINIC_OR_DEPARTMENT_OTHER): Payer: Self-pay

## 2018-07-22 ENCOUNTER — Other Ambulatory Visit: Payer: Self-pay

## 2018-07-22 DIAGNOSIS — J45909 Unspecified asthma, uncomplicated: Secondary | ICD-10-CM | POA: Insufficient documentation

## 2018-07-22 DIAGNOSIS — H60501 Unspecified acute noninfective otitis externa, right ear: Secondary | ICD-10-CM | POA: Insufficient documentation

## 2018-07-22 DIAGNOSIS — F17290 Nicotine dependence, other tobacco product, uncomplicated: Secondary | ICD-10-CM | POA: Insufficient documentation

## 2018-07-22 MED ORDER — CIPROFLOXACIN-DEXAMETHASONE 0.3-0.1 % OT SUSP
4.0000 [drp] | Freq: Two times a day (BID) | OTIC | Status: DC
  Administered 2018-07-22: 4 [drp] via OTIC
  Filled 2018-07-22: qty 7.5

## 2018-07-22 NOTE — ED Triage Notes (Signed)
C/o right earache day 3-NAD-steady gait

## 2018-07-22 NOTE — Discharge Instructions (Signed)
Follow-up with your doctor as needed for continued pain 

## 2018-07-22 NOTE — ED Provider Notes (Signed)
[facility] EMERGENCY DEPARTMENT Provider Note   CSN: [id number] Arrival date & time: 07/22/18  1758    History   Chief Complaint Chief Complaint  Patient presents with  . Otalgia    HPI Lauren Bray is a 23 y.o. female.     HPI Patient presents with right ear pain.  Began around 3 days ago.  States began after using a Q-tip in her ear but states it did not have any pain with the use of a Q-tip.  Since then has had pain.  No fevers.  No drainage.  Pain is on the right ear.  Worse with swallowing and laughing.  Still able to hear out of the ear. Past Medical History:  Diagnosis Date  . Asthma   . Eczema     There are no active problems to display for this patient.   History reviewed. No pertinent surgical history.   OB History   No obstetric history on file.      Home Medications    Prior to Admission medications   Medication Sig Start Date End Date Taking? Authorizing Provider  ALBUTEROL IN Inhale into the lungs.    [provider]  erythromycin ophthalmic ointment Place a 1/2 inch ribbon of ointment into the lower eyelid x4 days for 1 week. 04/20/18   Maxwell Caul, PA-C  ibuprofen (ADVIL,MOTRIN) 600 MG tablet Take 1 tablet (600 mg total) by mouth every 8 (eight) hours as needed. 08/13/17   Azalia Bilis, MD  naproxen (NAPROSYN) 500 MG tablet Take 1 tablet (500 mg total) by mouth 2 (two) times daily. 09/22/17   Horton, Mayer Masker, MD  Triamcinolone Acetonide (TRIAMCINOLONE 0.1 % CREAM : EUCERIN) CREA Apply 1 application topically 2 (two) times daily. 05/15/18   Sabas Sous, MD  triamcinolone cream (KENALOG) 0.1 % Apply 1 application topically 2 (two) times daily. 01/09/18   Bethel Born, PA-C    Family History No family history on file.  Social History Social History   Tobacco Use  . Smoking status: Current Every Day Smoker    Packs/day: 1.00    Types: Cigars  . Smokeless tobacco: Never Used  Substance Use Topics  .  Alcohol use: No  . Drug use: No     Allergies   Orange juice [orange oil]   Review of Systems Review of Systems  Constitutional: Positive for appetite change. Negative for fever.  HENT: Positive for ear pain. Negative for sore throat.   Eyes: Negative for pain.  Respiratory: Negative for shortness of breath.   Cardiovascular: Negative for chest pain.     Physical Exam Updated Vital Signs BP 130/86 (BP Location: Left Arm)   Pulse 98   Temp 98.5 F (36.9 C) (Oral)   Resp 18   Ht 5\' 3"  (1.6 m)   Wt 108 kg   LMP 07/11/2018   SpO2 98%   BMI 42.16 kg/m   Physical Exam Constitutional:      Appearance: Normal appearance.  HENT:     Head: Normocephalic.     Comments: Right-sided swelling of the external auditory canal.  TM appears intact.  No erythema.  No effusion.    Left Ear: Tympanic membrane normal.  Neck:     Musculoskeletal: Neck supple.  Cardiovascular:     Rate and Rhythm: Regular rhythm.      ED Treatments / Results  Labs (all labs ordered are listed, but only abnormal results are displayed) Labs Reviewed - No data  to display  EKG None  Radiology No results found.  Procedures Procedures (including critical care time)  Medications Ordered in ED Medications  ciprofloxacin-dexamethasone (CIPRODEX) 0.3-0.1 % OTIC (EAR) suspension 4 drop (has no administration in time range)     Initial Impression / Assessment and Plan / ED Course  I have reviewed the triage vital signs and the nursing notes.  Pertinent labs & imaging results that were available during my care of the patient were reviewed by me and considered in my medical decision making (see chart for details).        Patient with apparent otitis externa on the right.  No changes of the TM.  No drainage but does have some moistness in the canal.  Will treat with drops.  Discharge home.  Patient had been using a Q-tip in her ear but states did not have acute pain and the pain began around 2  hours later.  Final Clinical Impressions(s) / ED Diagnoses   Final diagnoses:  Acute otitis externa of right ear, unspecified type    ED Discharge Orders    None       Benjiman CorePickering, Sherlonda Flater, MD 07/22/18 [phone]

## 2018-08-22 ENCOUNTER — Emergency Department (HOSPITAL_BASED_OUTPATIENT_CLINIC_OR_DEPARTMENT_OTHER)
Admission: EM | Admit: 2018-08-22 | Discharge: 2018-08-22 | Disposition: A | Discharge status: Home / Self Care | Payer: Medicaid Other | Attending: Emergency Medicine | Admitting: Emergency Medicine

## 2018-08-22 ENCOUNTER — Other Ambulatory Visit: Payer: Self-pay

## 2018-08-22 ENCOUNTER — Encounter (HOSPITAL_BASED_OUTPATIENT_CLINIC_OR_DEPARTMENT_OTHER): Payer: Self-pay | Admitting: *Deleted

## 2018-08-22 DIAGNOSIS — F17290 Nicotine dependence, other tobacco product, uncomplicated: Secondary | ICD-10-CM | POA: Insufficient documentation

## 2018-08-22 DIAGNOSIS — L308 Other specified dermatitis: Secondary | ICD-10-CM

## 2018-08-22 DIAGNOSIS — J45909 Unspecified asthma, uncomplicated: Secondary | ICD-10-CM | POA: Insufficient documentation

## 2018-08-22 MED ORDER — TRIAMCINOLONE 0.1 % CREAM:EUCERIN CREAM 1:1: 1.0000 "application " | TOPICAL_CREAM | Freq: Two times a day (BID) | CUTANEOUS | 2 refills | Status: DC

## 2018-08-22 MED FILL — CMP-TAC.1%CR/EUCERINCR 1:1: 14 days supply | Qty: 240 | Fill #0

## 2018-08-22 NOTE — Discharge Instructions (Addendum)
You were seen today for worsened eczema. We refilled your triamcinolone cream. Consider switching to a hypoallergenic detergent. Make sure you wash your hands thoroughly after you come into contact with animals. You should make an appointment to be seen by an allergist as they may have better long term treatments for your eczema. If you develop fever, shortness of breath, chest pain, come back to the ED to be seen.

## 2018-08-22 NOTE — ED Provider Notes (Signed)
[place] [facility] EMERGENCY DEPARTMENT Provider Note   CSN: [id number] Arrival date & time: 08/22/18  1425   History   Chief Complaint Chief Complaint  Patient presents with  . Rash    HPI Lauren Bray is a 23 y.o. female with PMH significant for asthma, allergies and eczema here for worsened eczema to arms, legs, back, chest ever since she ran out of her triamcinolone cream. Her steroid cream usually controls her symptoms. Her eczema usually flares around this time of the year with worsened environmental allergies. She denies any other known triggers. She uses Dove body wash and Cetaphil face wash. She did recently switch detergents to Tide pods. She does not wear perfume. She denies recent worsening in her asthma. She frequently uses vaseline for emoillient. She does not have a regular doctor. She denies cough, congestion. She does endorse sneezing and itchy throat which she attributes to her normal allergy symptoms. She denies sick contacts.    Past Medical History:  Diagnosis Date  . Asthma   . Eczema     There are no active problems to display for this patient.  History reviewed. No pertinent surgical history.   OB History   No obstetric history on file.      Home Medications    Prior to Admission medications   Medication Sig Start Date End Date Taking? Authorizing Provider  ALBUTEROL IN Inhale into the lungs.    [provider]  erythromycin ophthalmic ointment Place a 1/2 inch ribbon of ointment into the lower eyelid x4 days for 1 week. 04/20/18   Volanda Napoleon, PA-C  ibuprofen (ADVIL,MOTRIN) 600 MG tablet Take 1 tablet (600 mg total) by mouth every 8 (eight) hours as needed. 08/13/17   Jola Schmidt, MD  naproxen (NAPROSYN) 500 MG tablet Take 1 tablet (500 mg total) by mouth 2 (two) times daily. 09/22/17   Horton, Barbette Hair, MD  Triamcinolone Acetonide (TRIAMCINOLONE 0.1 % CREAM : EUCERIN) CREA Apply 1 application topically 2 (two) times daily.  08/22/18   Rory Percy, DO  triamcinolone cream (KENALOG) 0.1 % Apply 1 application topically 2 (two) times daily. 01/09/18   Recardo Evangelist, PA-C    Family History No family history on file.  Social History Social History   Tobacco Use  . Smoking status: Current Every Day Smoker    Packs/day: 1.00    Types: Cigars  . Smokeless tobacco: Never Used  Substance Use Topics  . Alcohol use: No  . Drug use: No     Allergies   Orange juice [orange oil]   Review of Systems Review of Systems  Constitutional: Negative for fever.  HENT: Positive for sneezing. Negative for congestion, rhinorrhea and sore throat.   Respiratory: Negative for cough and shortness of breath.   Cardiovascular: Negative for chest pain.  Gastrointestinal: Negative for abdominal pain, nausea and vomiting.  Skin: Positive for rash.  Allergic/Immunologic: Positive for environmental allergies.     Physical Exam Updated Vital Signs BP 139/86   Pulse (!) 101   Temp 98.3 F (36.8 C) (Oral)   Resp 18   Ht 5\' 3"  (1.6 m)   Wt 109.4 kg   LMP 07/28/2018   SpO2 100%   BMI 42.71 kg/m   Physical Exam Constitutional:      General: She is not in acute distress.    Appearance: She is obese. She is not ill-appearing, toxic-appearing or diaphoretic.  HENT:     Head: Normocephalic.  Cardiovascular:  Rate and Rhythm: Normal rate and regular rhythm.     Heart sounds: Normal heart sounds. No murmur.  Pulmonary:     Effort: Pulmonary effort is normal. No respiratory distress.     Breath sounds: Normal breath sounds. No wheezing, rhonchi or rales.  Abdominal:     General: Bowel sounds are normal.     Palpations: Abdomen is soft.     Tenderness: There is no abdominal tenderness.  Musculoskeletal: Normal range of motion.  Lymphadenopathy:     Cervical: No cervical adenopathy.  Skin:    General: Skin is warm.     Comments: Dry, patchy, hyperpigmented rash present diffusely over extensors of arm and  flexure surfaces of legs, and all over back. No bleeding or discharge noted.  Neurological:     Mental Status: She is alert.     ED Treatments / Results  Labs (all labs ordered are listed, but only abnormal results are displayed) Labs Reviewed - No data to display  EKG None  Radiology No results found.  Procedures Procedures (including critical care time)  Medications Ordered in ED Medications - No data to display   Initial Impression / Assessment and Plan / ED Course  I have reviewed the triage vital signs and the nursing notes.  Pertinent labs & imaging results that were available during my care of the patient were reviewed by me and considered in my medical decision making (see chart for details).   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 Exam consistent with eczema without alarm features for insect/tick bites or systemic illness. Vitals stable. Refill for triamcinolone given. Recommend follow up with allergist and PCP.    Final Clinical Impressions(s) / ED Diagnoses   Final diagnoses:  Other eczema    ED Discharge Orders         Ordered    Triamcinolone Acetonide (TRIAMCINOLONE 0.1 % CREAM : EUCERIN) CREA  2 times daily     08/22/18 1528           Ellwood DenseRumball, Dustine Bertini, DO 08/22/18 1616    Alvira MondaySchlossman, Erin, MD 08/23/18 1527

## 2018-08-22 NOTE — ED Triage Notes (Signed)
Rash. States she is having a flare up of eczema. She needs a Rx.

## 2018-10-21 ENCOUNTER — Encounter (HOSPITAL_BASED_OUTPATIENT_CLINIC_OR_DEPARTMENT_OTHER): Payer: Self-pay | Admitting: *Deleted

## 2018-10-21 ENCOUNTER — Other Ambulatory Visit: Payer: Self-pay

## 2018-10-21 ENCOUNTER — Emergency Department (HOSPITAL_BASED_OUTPATIENT_CLINIC_OR_DEPARTMENT_OTHER)
Admission: EM | Admit: 2018-10-21 | Discharge: 2018-10-21 | Disposition: A | Discharge status: Home / Self Care | Payer: Medicaid Other | Attending: Emergency Medicine | Admitting: Emergency Medicine

## 2018-10-21 DIAGNOSIS — F17290 Nicotine dependence, other tobacco product, uncomplicated: Secondary | ICD-10-CM | POA: Insufficient documentation

## 2018-10-21 DIAGNOSIS — Z91018 Allergy to other foods: Secondary | ICD-10-CM | POA: Insufficient documentation

## 2018-10-21 DIAGNOSIS — H00015 Hordeolum externum left lower eyelid: Secondary | ICD-10-CM

## 2018-10-21 DIAGNOSIS — J45909 Unspecified asthma, uncomplicated: Secondary | ICD-10-CM | POA: Insufficient documentation

## 2018-10-21 MED ORDER — ERYTHROMYCIN 5 MG/GM OP OINT: TOPICAL_OINTMENT | OPHTHALMIC | 0 refills | Status: DC

## 2018-10-21 MED FILL — ERYTHROMYCIN EYE OINTMENT: 5 | 7 days supply | Qty: 4 | Fill #0

## 2018-10-21 NOTE — ED Provider Notes (Signed)
[place] EMERGENCY DEPARTMENT Provider Note   CSN: [id number] Arrival date & time: 10/21/18  1130     History   Chief Complaint Chief Complaint  Patient presents with  . Stye    HPI Lauren Bray is a 23 y.o. female.     Pt presents to the ED today with a stye to her left lower eyelid.  She has had them in the past.  She noticed it yesterday.       Past Medical History:  Diagnosis Date  . Asthma   . Eczema     There are no active problems to display for this patient.   History reviewed. No pertinent surgical history.   OB History   No obstetric history on file.      Home Medications    Prior to Admission medications   Medication Sig Start Date End Date Taking? Authorizing Provider  ALBUTEROL IN Inhale into the lungs.    [provider]  erythromycin ophthalmic ointment Place a 1/2 inch ribbon of ointment into the lower eyelid. 10/21/18   Isla Pence, MD  erythromycin ophthalmic ointment Place a 1/2 inch ribbon of ointment into the lower eyelid x4 days for 1 week. 10/21/18   Isla Pence, MD  ibuprofen (ADVIL,MOTRIN) 600 MG tablet Take 1 tablet (600 mg total) by mouth every 8 (eight) hours as needed. 08/13/17   Jola Schmidt, MD  naproxen (NAPROSYN) 500 MG tablet Take 1 tablet (500 mg total) by mouth 2 (two) times daily. 09/22/17   Horton, Barbette Hair, MD  Triamcinolone Acetonide (TRIAMCINOLONE 0.1 % CREAM : EUCERIN) CREA Apply 1 application topically 2 (two) times daily. 08/22/18   Rory Percy, DO  triamcinolone cream (KENALOG) 0.1 % Apply 1 application topically 2 (two) times daily. 01/09/18   Recardo Evangelist, PA-C    Family History No family history on file.  Social History Social History   Tobacco Use  . Smoking status: Current Every Day Smoker    Packs/day: 1.00    Types: Cigars  . Smokeless tobacco: Never Used  Substance Use Topics  . Alcohol use: No  . Drug use: No     Allergies   Orange juice [orange oil]   Review of Systems Review of Systems  HENT:       Stye left eye  All other systems reviewed and are negative.    Physical Exam Updated Vital Signs BP 117/80   Pulse 90   Temp 98.8 F (37.1 C) (Oral)   Resp 20   Ht 5\' 3"  (1.6 m)   Wt 108.9 kg   LMP 09/30/2018   SpO2 100%   BMI 42.51 kg/m   Physical Exam Vitals signs and nursing note reviewed.  Constitutional:      Appearance: Normal appearance.  HENT:     Head: Normocephalic and atraumatic.     Right Ear: External ear normal.     Left Ear: External ear normal.     Nose: Nose normal.     Mouth/Throat:     Mouth: Mucous membranes are moist.     Pharynx: Oropharynx is clear.  Eyes:     General:        Left eye: Hordeolum present.    Extraocular Movements: Extraocular movements intact.     Conjunctiva/sclera: Conjunctivae normal.     Pupils: Pupils are equal, round, and reactive to light.  Neck:     Musculoskeletal: Normal range of motion and neck supple.  Cardiovascular:  Rate and Rhythm: Normal rate and regular rhythm.     Pulses: Normal pulses.     Heart sounds: Normal heart sounds.  Pulmonary:     Effort: Pulmonary effort is normal.     Breath sounds: Normal breath sounds.  Abdominal:     General: Abdomen is flat. Bowel sounds are normal.     Palpations: Abdomen is soft.  Skin:    Capillary Refill: Capillary refill takes less than 2 seconds.     Comments: Eczema noted around lips and eyelids  Neurological:     General: No focal deficit present.     Mental Status: She is alert and oriented to person, place, and time.  Psychiatric:        Mood and Affect: Mood normal.        Behavior: Behavior normal.      ED Treatments / Results  Labs (all labs ordered are listed, but only abnormal results are displayed) Labs Reviewed - No data to display  EKG None  Radiology No results found.  Procedures Procedures (including critical care time)  Medications Ordered in ED Medications - No data to  display   Initial Impression / Assessment and Plan / ED Course  I have reviewed the triage vital signs and the nursing notes.  Pertinent labs & imaging results that were available during my care of the patient were reviewed by me and considered in my medical decision making (see chart for details).        Pt d/c home with erythromycin ointment.  She is instructed to f/u with ophthalmology.  Final Clinical Impressions(s) / ED Diagnoses   Final diagnoses:  Hordeolum externum of left lower eyelid    ED Discharge Orders         Ordered    erythromycin ophthalmic ointment     10/21/18 1150    erythromycin ophthalmic ointment     10/21/18 1150           Jacalyn LefevreHaviland, Kobee Medlen, MD 10/21/18 1152

## 2018-10-21 NOTE — ED Triage Notes (Signed)
Stye on her left lower eye lid.

## 2018-11-04 MED FILL — CMP-TAC.1%CR/EUCERINCR 1:1: 14 days supply | Qty: 240 | Fill #1

## 2018-12-02 ENCOUNTER — Other Ambulatory Visit: Payer: Self-pay

## 2018-12-02 ENCOUNTER — Encounter (HOSPITAL_BASED_OUTPATIENT_CLINIC_OR_DEPARTMENT_OTHER): Payer: Self-pay | Admitting: Emergency Medicine

## 2018-12-02 ENCOUNTER — Emergency Department (HOSPITAL_BASED_OUTPATIENT_CLINIC_OR_DEPARTMENT_OTHER)
Admission: EM | Admit: 2018-12-02 | Discharge: 2018-12-02 | Disposition: A | Discharge status: Home / Self Care | Payer: Medicaid Other | Attending: Emergency Medicine | Admitting: Emergency Medicine

## 2018-12-02 DIAGNOSIS — H04302 Unspecified dacryocystitis of left lacrimal passage: Secondary | ICD-10-CM

## 2018-12-02 DIAGNOSIS — H04301 Unspecified dacryocystitis of right lacrimal passage: Secondary | ICD-10-CM | POA: Insufficient documentation

## 2018-12-02 DIAGNOSIS — F17210 Nicotine dependence, cigarettes, uncomplicated: Secondary | ICD-10-CM | POA: Insufficient documentation

## 2018-12-02 DIAGNOSIS — Z91018 Allergy to other foods: Secondary | ICD-10-CM | POA: Insufficient documentation

## 2018-12-02 DIAGNOSIS — Z79899 Other long term (current) drug therapy: Secondary | ICD-10-CM | POA: Insufficient documentation

## 2018-12-02 DIAGNOSIS — J45909 Unspecified asthma, uncomplicated: Secondary | ICD-10-CM | POA: Insufficient documentation

## 2018-12-02 MED ORDER — TETRACAINE HCL 0.5 % OP SOLN
2.0000 [drp] | Freq: Once | OPHTHALMIC | Status: DC
  Filled 2018-12-02: qty 4

## 2018-12-02 MED ORDER — DOXYCYCLINE HYCLATE 100 MG PO CAPS: 100.0000 mg | ORAL_CAPSULE | Freq: Two times a day (BID) | ORAL | 0 refills | Status: DC

## 2018-12-02 MED ORDER — FLUORESCEIN SODIUM 1 MG OP STRP
1.0000 | ORAL_STRIP | Freq: Once | OPHTHALMIC | Status: DC
  Filled 2018-12-02: qty 1

## 2018-12-02 MED ORDER — ERYTHROMYCIN 5 MG/GM OP OINT: TOPICAL_OINTMENT | OPHTHALMIC | 0 refills | Status: AC

## 2018-12-02 NOTE — Discharge Instructions (Signed)
Apply warm compresses, take the medications as prescribed, follow up with an eye doctor for further evaluation

## 2018-12-02 NOTE — ED Triage Notes (Signed)
R eye pain and swelling since yesterday. States she has a stye.

## 2018-12-02 NOTE — ED Provider Notes (Signed)
[facility] EMERGENCY DEPARTMENT Provider Note   CSN: [id number] Arrival date & time: 12/02/18  1419     History   Chief Complaint Chief Complaint  Patient presents with  . Eye Problem    HPI Lauren Bray is a 23 y.o. female.     HPI Patient presents to the emergency room for evaluation of swelling in the inner aspect of her left eye.  Patient states she noticed the symptoms this morning.  She thinks she might have a stye.  She feels a bump on the inner aspect of her left arm.  She has noticed some redness around her eye and has tenderness primarily in the medial aspect.  She denies any recent injuries.  No blurred vision.  Patient has had similar episodes like this in the past. Past Medical History:  Diagnosis Date  . Asthma   . Eczema     There are no active problems to display for this patient.   History reviewed. No pertinent surgical history.   OB History   No obstetric history on file.      Home Medications    Prior to Admission medications   Medication Sig Start Date End Date Taking? Authorizing Provider  ALBUTEROL IN Inhale into the lungs.    [provider]  doxycycline (VIBRAMYCIN) 100 MG capsule Take 1 capsule (100 mg total) by mouth 2 (two) times daily. 12/02/18   Linwood Dibbles, MD  erythromycin ophthalmic ointment Place a 1/2 inch ribbon of ointment into the lower eyelid QID 12/02/18   Linwood Dibbles, MD  ibuprofen (ADVIL,MOTRIN) 600 MG tablet Take 1 tablet (600 mg total) by mouth every 8 (eight) hours as needed. 08/13/17   Azalia Bilis, MD  naproxen (NAPROSYN) 500 MG tablet Take 1 tablet (500 mg total) by mouth 2 (two) times daily. 09/22/17   Horton, Mayer Masker, MD  Triamcinolone Acetonide (TRIAMCINOLONE 0.1 % CREAM : EUCERIN) CREA Apply 1 application topically 2 (two) times daily. 08/22/18   Ellwood Dense, DO  triamcinolone cream (KENALOG) 0.1 % Apply 1 application topically 2 (two) times daily. 01/09/18   Bethel Born, PA-C     Family History No family history on file.  Social History Social History   Tobacco Use  . Smoking status: Current Every Day Smoker    Packs/day: 1.00    Types: Cigars  . Smokeless tobacco: Never Used  Substance Use Topics  . Alcohol use: No  . Drug use: No     Allergies   Orange juice [orange oil]   Review of Systems Review of Systems  All other systems reviewed and are negative.    Physical Exam Updated Vital Signs BP 122/80 (BP Location: Left Arm)   Pulse (!) 104   Temp 98.1 F (36.7 C) (Oral)   Resp 14   Ht 1.6 m (5\' 3" )   Wt 108.9 kg   LMP 11/11/2018   SpO2 100%   BMI 42.51 kg/m   Physical Exam Vitals signs and nursing note reviewed.  Constitutional:      General: She is not in acute distress.    Appearance: She is well-developed.  HENT:     Head: Normocephalic and atraumatic. Right periorbital erythema present. No abrasion, contusion, masses or laceration.     Right Ear: External ear normal.     Left Ear: External ear normal.  Eyes:     General: No visual field deficit or scleral icterus.       Right eye: No foreign  body, discharge or hordeolum.        Left eye: No foreign body, discharge or hordeolum.     Extraocular Movements: Extraocular movements intact.     Conjunctiva/sclera: Conjunctivae normal.     Comments: Small nodule palpable medical aspect right eye, nasolacrimal fold, mild edema, erythema upper eyelid  Neck:     Musculoskeletal: Neck supple.     Trachea: No tracheal deviation.  Cardiovascular:     Rate and Rhythm: Normal rate.  Pulmonary:     Effort: Pulmonary effort is normal. No respiratory distress.     Breath sounds: No stridor.  Abdominal:     General: There is no distension.  Musculoskeletal:        General: No swelling or deformity.  Skin:    General: Skin is warm and dry.     Findings: No rash.  Neurological:     Mental Status: She is alert.     Cranial Nerves: Cranial nerve deficit: no gross deficits.      ED  Treatments / Results   Procedures Procedures (including critical care time)  Medications Ordered in ED Medications  tetracaine (PONTOCAINE) 0.5 % ophthalmic solution 2 drop (has no administration in time range)  fluorescein ophthalmic strip 1 strip (has no administration in time range)     Initial Impression / Assessment and Plan / ED Course  I have reviewed the triage vital signs and the nursing notes.  Pertinent labs & imaging results that were available during my care of the patient were reviewed by me and considered in my medical decision making (see chart for details).   Exam is consistent with dacryocystitis rather than a stye.  Findings do not suggest an orbital cellulitis.  Patient has no findings of the eye itself. plan on antibiotic drops and ointment.  Warm compresses.  Outpatient referral to ophthalmology.  Final Clinical Impressions(s) / ED Diagnoses   Final diagnoses:  Dacryocystitis of left lacrimal sac    ED Discharge Orders         Ordered    doxycycline (VIBRAMYCIN) 100 MG capsule  2 times daily     12/02/18 1540    erythromycin ophthalmic ointment     12/02/18 1540           Dorie Rank, MD 12/02/18 1543

## 2018-12-31 MED FILL — CMP-TAC.1%CR/EUCERINCR 1:1: 14 days supply | Qty: 240 | Fill #2

## 2019-02-24 MED FILL — CMP-TAC.1%CR/EUCERINCR 1:1: 15 days supply | Qty: 240 | Fill #1

## 2019-03-28 ENCOUNTER — Other Ambulatory Visit: Payer: Self-pay

## 2019-03-28 ENCOUNTER — Encounter (HOSPITAL_BASED_OUTPATIENT_CLINIC_OR_DEPARTMENT_OTHER): Payer: Self-pay | Admitting: Emergency Medicine

## 2019-03-28 ENCOUNTER — Emergency Department (HOSPITAL_BASED_OUTPATIENT_CLINIC_OR_DEPARTMENT_OTHER)
Admission: EM | Admit: 2019-03-28 | Discharge: 2019-03-28 | Disposition: A | Discharge status: Home / Self Care | Payer: Medicaid Other | Attending: Emergency Medicine | Admitting: Emergency Medicine

## 2019-03-28 DIAGNOSIS — H60501 Unspecified acute noninfective otitis externa, right ear: Secondary | ICD-10-CM

## 2019-03-28 DIAGNOSIS — F17290 Nicotine dependence, other tobacco product, uncomplicated: Secondary | ICD-10-CM | POA: Insufficient documentation

## 2019-03-28 DIAGNOSIS — J45909 Unspecified asthma, uncomplicated: Secondary | ICD-10-CM | POA: Insufficient documentation

## 2019-03-28 DIAGNOSIS — Z79899 Other long term (current) drug therapy: Secondary | ICD-10-CM | POA: Insufficient documentation

## 2019-03-28 MED ORDER — OFLOXACIN 0.3 % OT SOLN: 5.0000 [drp] | Freq: Two times a day (BID) | OTIC | 0 refills | Status: AC

## 2019-03-28 NOTE — ED Triage Notes (Signed)
R ear pain x 5 days.

## 2019-03-28 NOTE — ED Provider Notes (Signed)
[facility] EMERGENCY DEPARTMENT Provider Note   CSN: [id number] Arrival date & time: 03/28/19  1239     History Chief Complaint  Patient presents with  . Otalgia    Lauren Bray is a 24 y.o. female with a past medical history significant for asthma and eczema who presents to the ED due to gradual onset of worsening right ear pain x 5 days. Patient notes ear pain is associated with muffled hearing and sounds like she's "underwater". Patient denies drainage from ear. Denies recent swimming. She has tried over the counter ear drops with moderate relief at the beginning. Patient denies fever and chills. Denies recent illness. Patient denies cough, nasal congestion, sore throat, neck pain, abdominal pain, chest pain, and shortness of breath.     Past Medical History:  Diagnosis Date  . Asthma   . Eczema     There are no problems to display for this patient.   History reviewed. No pertinent surgical history.   OB History   No obstetric history on file.     No family history on file.  Social History   Tobacco Use  . Smoking status: Current Every Day Smoker    Packs/day: 1.00    Types: Cigars  . Smokeless tobacco: Never Used  Substance Use Topics  . Alcohol use: No  . Drug use: No    Home Medications Prior to Admission medications   Medication Sig Start Date End Date Taking? Authorizing Provider  ALBUTEROL IN Inhale into the lungs.    [provider]  doxycycline (VIBRAMYCIN) 100 MG capsule Take 1 capsule (100 mg total) by mouth 2 (two) times daily. 12/02/18   Linwood Dibbles, MD  erythromycin ophthalmic ointment Place a 1/2 inch ribbon of ointment into the lower eyelid QID 12/02/18   Linwood Dibbles, MD  ibuprofen (ADVIL,MOTRIN) 600 MG tablet Take 1 tablet (600 mg total) by mouth every 8 (eight) hours as needed. 08/13/17   Azalia Bilis, MD  naproxen (NAPROSYN) 500 MG tablet Take 1 tablet (500 mg total) by mouth 2 (two) times daily. 09/22/17   Horton, Mayer Masker, MD  ofloxacin (FLOXIN) 0.3 % OTIC solution Place 5 drops into the right ear 2 (two) times daily for 7 days. 03/28/19 04/04/19  Mannie Stabile, PA-C  Triamcinolone Acetonide (TRIAMCINOLONE 0.1 % CREAM : EUCERIN) CREA Apply 1 application topically 2 (two) times daily. 08/22/18   Ellwood Dense, DO  triamcinolone cream (KENALOG) 0.1 % Apply 1 application topically 2 (two) times daily. 01/09/18   Bethel Born, PA-C    Allergies    Orange juice Erskine Emery oil]  Review of Systems   Review of Systems  Constitutional: Negative for chills and fever.  HENT: Positive for ear pain and hearing loss (muffled). Negative for congestion, ear discharge, facial swelling, sinus pressure, sinus pain and sore throat.   Eyes: Negative for pain and redness.  Musculoskeletal: Negative for neck pain.  Neurological: Negative for headaches.    Physical Exam Updated Vital Signs BP 131/75 (BP Location: Right Arm)   Pulse 86   Temp 99 F (37.2 C) (Oral)   Resp 18   Ht 5\' 3"  (1.6 m)   Wt 104.3 kg   LMP 03/04/2019   SpO2 100%   BMI 40.74 kg/m   Physical Exam Vitals and nursing note reviewed.  Constitutional:      General: She is not in acute distress.    Appearance: She is not ill-appearing.  HENT:  Head: Normocephalic.     Left Ear: Tympanic membrane, ear canal and external ear normal.     Ears:     Comments: Right ear: tenderness to palpation over right tragus. No mastoid tenderness. Ear canal with copious amount of white debri with erythema and edema. Unable to visualize full TM due to edema. Partial visualization without signs of AOM.     Mouth/Throat:     Mouth: Mucous membranes are moist.     Pharynx: No oropharyngeal exudate or posterior oropharyngeal erythema.  Eyes:     Pupils: Pupils are equal, round, and reactive to light.  Neck:     Comments: No meningismus.  Cardiovascular:     Rate and Rhythm: Normal rate and regular rhythm.     Pulses: Normal pulses.     Heart sounds:  Normal heart sounds. No murmur. No friction rub. No gallop.   Pulmonary:     Effort: Pulmonary effort is normal.     Breath sounds: Normal breath sounds.  Abdominal:     General: Abdomen is flat. Bowel sounds are normal. There is no distension.     Palpations: Abdomen is soft.  Musculoskeletal:     Cervical back: Neck supple.     Comments: Able to move all 4 extremities without difficulty.   Skin:    General: Skin is warm and dry.  Neurological:     General: No focal deficit present.     Mental Status: She is alert.     ED Results / Procedures / Treatments   Labs (all labs ordered are listed, but only abnormal results are displayed) Labs Reviewed - No data to display  EKG None  Radiology No results found.  Procedures Procedures (including critical care time)  Medications Ordered in ED Medications - No data to display  ED Course  I have reviewed the triage vital signs and the nursing notes.  Pertinent labs & imaging results that were available during my care of the patient were reviewed by me and considered in my medical decision making (see chart for details).    MDM Rules/Calculators/A&P                     24 year old female presents with exam findings consistent with right otitis externa. No full canal occlusion, but partial occlusion due to debri. Partial visualization of TM did not show signs of AOM, but unable to rule out perforation. Patient afebrile in NAD. Exam non concerning for mastoiditis, cellulitis or malignant OE. Discharged patient with floxin drops given full TM not visualized and unable to rule out perforation. [facility] number given to patient at discharge. Advised patient to follow-up with [facility] if symptoms do not improve within the next week. Instructed patient to take over the counter ibuprofen or Tylenol as needed for pain. Strict ED precautions discussed with patient. Patient states understanding and agrees to plan. Patient discharged  home in no acute distress and stable vitals  Final Clinical Impression(s) / ED Diagnoses Final diagnoses:  Acute otitis externa of right ear, unspecified type    Rx / DC Orders ED Discharge Orders         Ordered    ofloxacin (FLOXIN) 0.3 % OTIC solution  2 times daily     03/28/19 1424           Karie Kirks 03/28/19 1440    Blanchie Dessert, MD 03/28/19 1512

## 2019-03-28 NOTE — Discharge Instructions (Addendum)
As discussed, you have an infection of your right ear canal. I am sending you home with antibiotic ear drops. Use 5 drops twice a day for 7 days. I have included the number for [facility]. Call Monday to schedule an appointment to establish care. You may follow-up with [facility] if your symptoms do not improve within the next week. Return to the ER for new or worsening symptoms.   You may take over the counter ibuprofen or tylenol as needed for pain.

## 2019-04-14 MED FILL — CMP-TAC.1%CR/EUCERINCR 1:1: 15 days supply | Qty: 240 | Fill #2

## 2019-04-27 MED FILL — CMP-TAC.1%CR/EUCERINCR 1:1: 15 days supply | Qty: 240 | Fill #2

## 2019-08-07 ENCOUNTER — Other Ambulatory Visit: Payer: Self-pay

## 2019-08-07 ENCOUNTER — Encounter (HOSPITAL_BASED_OUTPATIENT_CLINIC_OR_DEPARTMENT_OTHER): Payer: Self-pay

## 2019-08-07 ENCOUNTER — Emergency Department (HOSPITAL_BASED_OUTPATIENT_CLINIC_OR_DEPARTMENT_OTHER): Payer: No Typology Code available for payment source

## 2019-08-07 ENCOUNTER — Emergency Department (HOSPITAL_BASED_OUTPATIENT_CLINIC_OR_DEPARTMENT_OTHER)
Admission: EM | Admit: 2019-08-07 | Discharge: 2019-08-07 | Disposition: A | Discharge status: Home / Self Care | Payer: No Typology Code available for payment source | Attending: Emergency Medicine | Admitting: Emergency Medicine

## 2019-08-07 DIAGNOSIS — Z91018 Allergy to other foods: Secondary | ICD-10-CM | POA: Insufficient documentation

## 2019-08-07 DIAGNOSIS — S0990XA Unspecified injury of head, initial encounter: Secondary | ICD-10-CM | POA: Diagnosis not present

## 2019-08-07 DIAGNOSIS — Z87891 Personal history of nicotine dependence: Secondary | ICD-10-CM | POA: Diagnosis not present

## 2019-08-07 DIAGNOSIS — Y939 Activity, unspecified: Secondary | ICD-10-CM | POA: Insufficient documentation

## 2019-08-07 DIAGNOSIS — Y999 Unspecified external cause status: Secondary | ICD-10-CM | POA: Diagnosis not present

## 2019-08-07 DIAGNOSIS — Y92481 Parking lot as the place of occurrence of the external cause: Secondary | ICD-10-CM | POA: Insufficient documentation

## 2019-08-07 DIAGNOSIS — R519 Headache, unspecified: Secondary | ICD-10-CM | POA: Insufficient documentation

## 2019-08-07 DIAGNOSIS — Z041 Encounter for examination and observation following transport accident: Secondary | ICD-10-CM | POA: Diagnosis present

## 2019-08-07 MED ORDER — METHOCARBAMOL 500 MG PO TABS: 500.0000 mg | ORAL_TABLET | Freq: Two times a day (BID) | ORAL | 0 refills | Status: AC

## 2019-08-07 MED ORDER — ACETAMINOPHEN 325 MG PO TABS
650.0000 mg | ORAL_TABLET | Freq: Once | ORAL | Status: AC
  Administered 2019-08-07: 650 mg via ORAL
  Filled 2019-08-07: qty 2
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
## 2019-08-07 NOTE — ED Notes (Signed)
Patient transported to CT 

## 2019-09-07 ENCOUNTER — Emergency Department (HOSPITAL_BASED_OUTPATIENT_CLINIC_OR_DEPARTMENT_OTHER)
Admission: EM | Admit: 2019-09-07 | Discharge: 2019-09-07 | Disposition: A | Discharge status: Home / Self Care | Payer: No Typology Code available for payment source

## 2019-09-08 ENCOUNTER — Encounter (HOSPITAL_BASED_OUTPATIENT_CLINIC_OR_DEPARTMENT_OTHER): Payer: Self-pay | Admitting: *Deleted

## 2019-09-08 ENCOUNTER — Other Ambulatory Visit: Payer: Self-pay

## 2019-09-08 ENCOUNTER — Emergency Department (HOSPITAL_BASED_OUTPATIENT_CLINIC_OR_DEPARTMENT_OTHER)
Admission: EM | Admit: 2019-09-08 | Discharge: 2019-09-08 | Disposition: A | Discharge status: Home / Self Care | Payer: Medicaid Other | Attending: Emergency Medicine | Admitting: Emergency Medicine

## 2019-09-08 DIAGNOSIS — L309 Dermatitis, unspecified: Secondary | ICD-10-CM

## 2019-09-08 DIAGNOSIS — J45909 Unspecified asthma, uncomplicated: Secondary | ICD-10-CM | POA: Insufficient documentation

## 2019-09-08 DIAGNOSIS — Z87891 Personal history of nicotine dependence: Secondary | ICD-10-CM | POA: Insufficient documentation

## 2019-09-08 DIAGNOSIS — Z7951 Long term (current) use of inhaled steroids: Secondary | ICD-10-CM | POA: Insufficient documentation

## 2019-09-08 MED ORDER — TRIAMCINOLONE 0.1 % CREAM:EUCERIN CREAM 1:1: 1.0000 "application " | TOPICAL_CREAM | Freq: Two times a day (BID) | CUTANEOUS | 0 refills | Status: DC

## 2019-09-08 MED FILL — CMPD TAC 0.1%CRM/EUCERIN CR: 30 days supply | Qty: 60 | Fill #0

## 2019-09-08 NOTE — Discharge Instructions (Addendum)
It is important you follow up with dermatology. Thank you for allowing me to care for you today. Please return to the emergency department if you have new or worsening symptoms. Take your medications as instructed.

## 2019-09-08 NOTE — ED Provider Notes (Signed)
[facility] EMERGENCY DEPARTMENT Provider Note   CSN: [id number] Arrival date & time: 09/08/19  1112     History Chief Complaint  Patient presents with  . Rash    Lauren Bray is a 24 y.o. female.  Patient is a 24 year old female who has a past medical history of asthma and eczema presenting to the emergency department for eczema.  She reports that she usually uses triamcinolone cream but she ran out 3 days ago and her eczema is getting worse.  Mostly on her arms, torso and neck.  Denies any fever, chills, trouble breathing or swallowing.  Does not see a dermatologist but would like to        Past Medical History:  Diagnosis Date  . Asthma   . Eczema     There are no problems to display for this patient.   History reviewed. No pertinent surgical history.   OB History   No obstetric history on file.     No family history on file.  Social History   Tobacco Use  . Smoking status: Former Smoker    Packs/day: 1.00    Types: Cigars    Quit date: 05/07/2019    Years since quitting: 0.3  . Smokeless tobacco: Never Used  Vaping Use  . Vaping Use: Never used  Substance Use Topics  . Alcohol use: No  . Drug use: No    Home Medications Prior to Admission medications   Medication Sig Start Date End Date Taking? Authorizing Provider  ALBUTEROL IN Inhale into the lungs.    [provider]  doxycycline (VIBRAMYCIN) 100 MG capsule Take 1 capsule (100 mg total) by mouth 2 (two) times daily. 12/02/18   Linwood Dibbles, MD  erythromycin ophthalmic ointment Place a 1/2 inch ribbon of ointment into the lower eyelid QID 12/02/18   Linwood Dibbles, MD  ibuprofen (ADVIL,MOTRIN) 600 MG tablet Take 1 tablet (600 mg total) by mouth every 8 (eight) hours as needed. 08/13/17   Azalia Bilis, MD  methocarbamol (ROBAXIN) 500 MG tablet Take 1 tablet (500 mg total) by mouth 2 (two) times daily. 08/07/19   Khatri, Hina, PA-C  naproxen (NAPROSYN) 500 MG tablet Take 1 tablet (500 mg  total) by mouth 2 (two) times daily. 09/22/17   Horton, Mayer Masker, MD  Triamcinolone Acetonide (TRIAMCINOLONE 0.1 % CREAM : EUCERIN) CREA Apply 1 application topically 2 (two) times daily. Do not apply to face 09/08/19 10/08/19  Ronnie Doss A, PA-C  triamcinolone cream (KENALOG) 0.1 % Apply 1 application topically 2 (two) times daily. 01/09/18   Bethel Born, PA-C    Allergies    Orange juice Erskine Emery oil]  Review of Systems   Review of Systems  Constitutional: Negative for appetite change, chills and fever.  HENT: Negative for trouble swallowing.   Respiratory: Negative for chest tightness, shortness of breath and wheezing.   Gastrointestinal: Negative for nausea and vomiting.  Skin: Positive for rash.  Neurological: Negative for dizziness.  Hematological: Does not bruise/bleed easily.  All other systems reviewed and are negative.   Physical Exam Updated Vital Signs BP (!) 134/103   Pulse 98   Temp 98.2 F (36.8 C) (Oral)   Resp 20   Ht 5\' 3"  (1.6 m)   Wt 99.8 kg   SpO2 99%   BMI 38.97 kg/m   Physical Exam Vitals and nursing note reviewed.  Constitutional:      General: She is not in acute distress.    Appearance:  Normal appearance. She is not ill-appearing, toxic-appearing or diaphoretic.  HENT:     Head: Normocephalic and atraumatic.  Eyes:     Conjunctiva/sclera: Conjunctivae normal.  Cardiovascular:     Rate and Rhythm: Normal rate and regular rhythm.  Pulmonary:     Effort: Pulmonary effort is normal.  Skin:    General: Skin is warm and dry.     Findings: Rash present.     Comments: ill-defined, erythematous, scaly rash on flexor surfaces of arms and neck. No signs of secondary bacterial infection  Neurological:     Mental Status: She is alert.  Psychiatric:        Mood and Affect: Mood normal.     ED Results / Procedures / Treatments   Labs (all labs ordered are listed, but only abnormal results are displayed) Labs Reviewed - No data to  display  EKG None  Radiology No results found.  Procedures Procedures (including critical care time)  Medications Ordered in ED Medications - No data to display  ED Course  I have reviewed the triage vital signs and the nursing notes.  Pertinent labs & imaging results that were available during my care of the patient were reviewed by me and considered in my medical decision making (see chart for details).    MDM Rules/Calculators/A&P                          Based on review of vitals, medical screening exam, lab work and/or imaging, there does not appear to be an acute, emergent etiology for the patient's symptoms. Counseled pt on good return precautions and encouraged both PCP and ED follow-up as needed.  Prior to discharge, I also discussed incidental imaging findings with patient in detail and advised appropriate, recommended follow-up in detail.  Clinical Impression: 1. Eczema, unspecified type     Disposition: Discharge  Prior to providing a prescription for a controlled substance, I independently reviewed the patient's recent prescription history on the [place] Controlled Substance Reporting System. The patient had no recent or regular prescriptions and was deemed appropriate for a brief, less than 3 day prescription of narcotic for acute analgesia.  This note was prepared with assistance of Conservation officer, historic buildings. Occasional wrong-word or sound-a-like substitutions may have occurred due to the inherent limitations of voice recognition software.  Final Clinical Impression(s) / ED Diagnoses Final diagnoses:  Eczema, unspecified type    Rx / DC Orders ED Discharge Orders         Ordered    Triamcinolone Acetonide (TRIAMCINOLONE 0.1 % CREAM : EUCERIN) CREA  2 times daily     Discontinue  Reprint     09/08/19 1204           Jeral Pinch 09/08/19 1205    Tegeler, Canary Brim, MD 09/08/19 1534

## 2019-09-08 NOTE — ED Triage Notes (Signed)
Hx eczema. States she ran out of medication and needs a refill.

## 2019-10-03 ENCOUNTER — Encounter (HOSPITAL_BASED_OUTPATIENT_CLINIC_OR_DEPARTMENT_OTHER): Payer: Self-pay | Admitting: Emergency Medicine

## 2019-10-03 ENCOUNTER — Emergency Department (HOSPITAL_BASED_OUTPATIENT_CLINIC_OR_DEPARTMENT_OTHER)
Admission: EM | Admit: 2019-10-03 | Discharge: 2019-10-03 | Disposition: A | Discharge status: Home / Self Care | Payer: Medicaid Other | Attending: Emergency Medicine | Admitting: Emergency Medicine

## 2019-10-03 ENCOUNTER — Other Ambulatory Visit: Payer: Self-pay

## 2019-10-03 DIAGNOSIS — L309 Dermatitis, unspecified: Secondary | ICD-10-CM | POA: Insufficient documentation

## 2019-10-03 DIAGNOSIS — J45909 Unspecified asthma, uncomplicated: Secondary | ICD-10-CM | POA: Insufficient documentation

## 2019-10-03 DIAGNOSIS — Z87891 Personal history of nicotine dependence: Secondary | ICD-10-CM | POA: Insufficient documentation

## 2019-10-03 MED ORDER — TRIAMCINOLONE 0.1 % CREAM:EUCERIN CREAM 1:1: 1.0000 "application " | TOPICAL_CREAM | Freq: Two times a day (BID) | CUTANEOUS | 0 refills | Status: DC | PRN

## 2019-10-03 MED ORDER — TRIAMCINOLONE ACETONIDE 0.1 % EX CREA: 1.0000 "application " | TOPICAL_CREAM | Freq: Two times a day (BID) | CUTANEOUS | 0 refills | Status: DC

## 2019-10-03 MED ORDER — TRIAMCINOLONE ACETONIDE 0.1 % EX CREA: 1.0000 "application " | TOPICAL_CREAM | Freq: Two times a day (BID) | CUTANEOUS | 0 refills | Status: AC

## 2019-10-03 NOTE — Discharge Instructions (Signed)
You were seen in the emergency room today with eczema rash.  Please follow closely with your dermatologist as scheduled later in the month.  Do not apply this cream to your face.  Please return to emergency department any fever, painful rash, other sudden severe symptoms.

## 2019-10-03 NOTE — ED Triage Notes (Signed)
Itchy red rash all over body x 4 days. Hx eczema.

## 2019-10-03 NOTE — ED Provider Notes (Signed)
Emergency Department Provider Note   I have reviewed the triage vital signs and the nursing notes.   HISTORY  Chief Complaint Rash   HPI Lauren Bray is a 24 y.o. female with PMH of asthma and eczema presents to the emergency department with itchy, red rash over the arms/legs, chest, face.  Patient has a dermatology appointment scheduled for later this month but is here requesting a refill of her steroid cream which is worked for her in the past.  She does note some discoloration to her face and does note that occasionally she will apply the steroid cream to her face.  She is not experiencing fevers or chills.  No headache.  No radiation of symptoms or other modifying factors.  Past Medical History:  Diagnosis Date  . Asthma   . Eczema     There are no problems to display for this patient.   History reviewed. No pertinent surgical history.  Allergies Orange juice [orange oil]  History reviewed. No pertinent family history.  Social History Social History   Tobacco Use  . Smoking status: Former Smoker    Packs/day: 1.00    Types: Cigars    Quit date: 05/07/2019    Years since quitting: 0.4  . Smokeless tobacco: Never Used  Vaping Use  . Vaping Use: Never used  Substance Use Topics  . Alcohol use: No  . Drug use: No    Review of Systems  Constitutional: No fever/chills Skin: Positive itchy rash.  Neurological: Negative for headaches.   ____________________________________________   PHYSICAL EXAM:  VITAL SIGNS: ED Triage Vitals [10/03/19 0935]  Enc Vitals Group     BP (!) 150/98     Pulse Rate 88     Resp 18     Temp 98 F (36.7 C)     Temp Source Oral     SpO2 100 %   Constitutional: Alert and oriented. Well appearing and in no acute distress. Eyes: Conjunctivae are normal.  Head: Atraumatic. Nose: No congestion/rhinnorhea. Mouth/Throat: Mucous membranes are moist.  Neck: No stridor.  Cardiovascular: Normal rate, regular  rhythm. Respiratory: Normal respiratory effort.  Gastrointestinal: No distention.  Musculoskeletal: No gross deformities of extremities. Neurologic:  Normal speech and language. Skin: Skin is warm and dry.  Rash focused mainly in the flexor surfaces of the upper and lower extremities but spreading more centrally as well.  No petechial rash.  No abscess or cellulitis.  There is rash over the face as well with some decreased pigmentation over the chin. No ulcerations.   ____________________________________________   PROCEDURES  Procedure(s) performed:   Procedures  None  ____________________________________________   INITIAL IMPRESSION / ASSESSMENT AND PLAN / ED COURSE  Pertinent labs & imaging results that were available during my care of the patient were reviewed by me and considered in my medical decision making (see chart for details).   Patient presents emergency department with diffuse rash over arms, legs, trunk, face.  Rash is consistent with eczema.  She does have dermatology appointment scheduled for later this month.  I will refill her triamcinolone cream but have given strict precautions to not apply this cream to her face.  This could be contributing to the pigment change noted on exam today as she admits to doing this in the past.  Patient verbalizes understanding and will follow closely with her dermatologist.    ____________________________________________  FINAL CLINICAL IMPRESSION(S) / ED DIAGNOSES  Final diagnoses:  Eczema, unspecified type    Note:  This document was prepared using Dragon voice recognition software and may include unintentional dictation errors.  Alona Bene, MD, [facility] Emergency Medicine    Charmon Thorson, Arlyss Repress, MD 10/03/19 1013

## 2019-10-14 ENCOUNTER — Emergency Department (HOSPITAL_BASED_OUTPATIENT_CLINIC_OR_DEPARTMENT_OTHER)
Admission: EM | Admit: 2019-10-14 | Discharge: 2019-10-14 | Disposition: A | Discharge status: Home / Self Care | Payer: Medicaid Other | Attending: Emergency Medicine | Admitting: Emergency Medicine

## 2019-10-14 ENCOUNTER — Encounter (HOSPITAL_BASED_OUTPATIENT_CLINIC_OR_DEPARTMENT_OTHER): Payer: Self-pay | Admitting: Emergency Medicine

## 2019-10-14 ENCOUNTER — Ambulatory Visit (HOSPITAL_COMMUNITY)
Admission: EM | Admit: 2019-10-14 | Discharge: 2019-10-14 | Discharge status: Against Medical Advice | Payer: Medicaid Other

## 2019-10-14 ENCOUNTER — Other Ambulatory Visit: Payer: Self-pay

## 2019-10-14 DIAGNOSIS — J45909 Unspecified asthma, uncomplicated: Secondary | ICD-10-CM | POA: Insufficient documentation

## 2019-10-14 DIAGNOSIS — L309 Dermatitis, unspecified: Secondary | ICD-10-CM | POA: Insufficient documentation

## 2019-10-14 DIAGNOSIS — Z79899 Other long term (current) drug therapy: Secondary | ICD-10-CM | POA: Insufficient documentation

## 2019-10-14 DIAGNOSIS — Z87891 Personal history of nicotine dependence: Secondary | ICD-10-CM | POA: Insufficient documentation

## 2019-10-14 DIAGNOSIS — R21 Rash and other nonspecific skin eruption: Secondary | ICD-10-CM | POA: Insufficient documentation

## 2019-10-14 NOTE — ED Provider Notes (Signed)
[facility] EMERGENCY DEPARTMENT Provider Note   CSN: [id number] Arrival date & time: 10/14/19  1659     History Chief Complaint  Patient presents with  . Eczema    Lauren Bray is a 24 y.o. female w PMHx asthma, eczema, presenting to the ED with complaint of eczema. She states she ran out of her triamcinolone cream which she has been using for years.  She states however her cream runs out her rash flares up. She reports itchy rash to face, arms, chest. She states she has had discoloration to her skin and was told it may be from the cream. She states since her steroid cream ran out few days ago, she has been using Vaseline and Aveeno.  She states she is going out of town and is hoping of a refill of the triamcinolone cream to help her discomfort.  She has a new appointment with a dermatologist on 10/20/2019. She has not seen a dermatologist for this since she was a child.  The history is provided by the patient.       Past Medical History:  Diagnosis Date  . Asthma   . Eczema     There are no problems to display for this patient.   History reviewed. No pertinent surgical history.   OB History   No obstetric history on file.     No family history on file.  Social History   Tobacco Use  . Smoking status: Former Smoker    Packs/day: 1.00    Types: Cigars    Quit date: 05/07/2019    Years since quitting: 0.4  . Smokeless tobacco: Never Used  Vaping Use  . Vaping Use: Never used  Substance Use Topics  . Alcohol use: No  . Drug use: No    Home Medications Prior to Admission medications   Medication Sig Start Date End Date Taking? Authorizing Provider  ALBUTEROL IN Inhale into the lungs.    [provider]  doxycycline (VIBRAMYCIN) 100 MG capsule Take 1 capsule (100 mg total) by mouth 2 (two) times daily. 12/02/18   Linwood Dibbles, MD  erythromycin ophthalmic ointment Place a 1/2 inch ribbon of ointment into the lower eyelid QID 12/02/18   Linwood Dibbles,  MD  ibuprofen (ADVIL,MOTRIN) 600 MG tablet Take 1 tablet (600 mg total) by mouth every 8 (eight) hours as needed. 08/13/17   Azalia Bilis, MD  methocarbamol (ROBAXIN) 500 MG tablet Take 1 tablet (500 mg total) by mouth 2 (two) times daily. 08/07/19   Khatri, Hina, PA-C  naproxen (NAPROSYN) 500 MG tablet Take 1 tablet (500 mg total) by mouth 2 (two) times daily. 09/22/17   Horton, Mayer Masker, MD  triamcinolone cream (KENALOG) 0.1 % Apply 1 application topically 2 (two) times daily for 14 days. 10/03/19 10/17/19  Long, Arlyss Repress, MD    Allergies    Orange juice Erskine Emery oil]  Review of Systems   Review of Systems  Constitutional: Negative for fever.  Skin: Positive for rash.    Physical Exam Updated Vital Signs BP 126/75 (BP Location: Right Arm)   Pulse 85   Temp 97.8 F (36.6 C) (Oral)   Resp 20   Wt 99.5 kg   LMP 09/23/2019 (Approximate)   SpO2 99%   BMI 38.86 kg/m   Physical Exam Vitals and nursing note reviewed.  Constitutional:      General: She is not in acute distress.    Appearance: She is well-developed.  HENT:  Head: Normocephalic and atraumatic.  Eyes:     Conjunctiva/sclera: Conjunctivae normal.  Cardiovascular:     Rate and Rhythm: Normal rate.  Pulmonary:     Effort: Pulmonary effort is normal.  Skin:    Comments: Scattered areas of hyperpigmentation to arms, chest, face. She has scaly patches of skin to arms, chest, face. No vesicles or petechia, no pustules.  Neurological:     Mental Status: She is alert.  Psychiatric:        Mood and Affect: Mood normal.        Behavior: Behavior normal.     ED Results / Procedures / Treatments   Labs (all labs ordered are listed, but only abnormal results are displayed) Labs Reviewed - No data to display  EKG None  Radiology No results found.  Procedures Procedures (including critical care time)  Medications Ordered in ED Medications - No data to display  ED Course  I have reviewed the triage vital  signs and the nursing notes.  Pertinent labs & imaging results that were available during my care of the patient were reviewed by me and considered in my medical decision making (see chart for details).    MDM Rules/Calculators/A&P                          Patient with longstanding history of eczema and chronic use of triamcinolone cream, here for refill of cream.  She has hyperpigmentation to her skin, including her face after using this cream.  Patient made aware she is not to use this cream on her face during last ED visit, likely contributing to the hyperpigmentation.  She states it is causing her to be self-conscious.  She has an appointment with a dermatologist coming up on the 24th.  At this time, do not recommend refill of steroid cream due to long-term use and adverse effects..  Recommend alternative treatment including topical antihistamine, oral antihistamine, avoiding hot showers, skin hydration.  Patient verbalized understanding with this and agrees with care plan.   Final Clinical Impression(s) / ED Diagnoses Final diagnoses:  Eczema, unspecified type    Rx / DC Orders ED Discharge Orders    None       Lacy Taglieri, [place] N, PA-C 10/14/19 2032    Maia Plan, MD 10/19/19 1123

## 2019-10-14 NOTE — ED Notes (Signed)
Patient left before registration due to wait time. °

## 2019-10-14 NOTE — ED Notes (Signed)
Pt discharged to home. Discharge instructions have been discussed with patient and/or family members. Pt verbally acknowledges understanding d/c instructions, and endorses comprehension to checkout at registration before leaving.  °

## 2019-10-14 NOTE — Discharge Instructions (Addendum)
Avoid hot showers, this can dry out your skin and worsen your symptoms. Apply an emollient such as vaseline or aquaphor immediately after the shower. You can take a daytime antihistamine such as claritin (generic: loratadine) or zyrtec (cetirizine) to help with symptoms. You can also try applying a topical benadryl cream. Attend your dermatology appointment to discuss further management.

## 2019-10-14 NOTE — ED Triage Notes (Signed)
Out of eczema cream. Rash on arms for 5 days, on going problem. States going out of town and needs. RX. Has appt with Derm on 8/24

## 2019-10-14 NOTE — ED Notes (Signed)
ED Provider at bedside. 

## 2021-04-27 ENCOUNTER — Emergency Department (HOSPITAL_BASED_OUTPATIENT_CLINIC_OR_DEPARTMENT_OTHER)
Admission: EM | Admit: 2021-04-27 | Discharge: 2021-04-27 | Disposition: A | Discharge status: Home / Self Care | Payer: Medicaid Other | Attending: Emergency Medicine | Admitting: Emergency Medicine

## 2021-04-27 ENCOUNTER — Other Ambulatory Visit: Payer: Self-pay

## 2021-04-27 ENCOUNTER — Other Ambulatory Visit (HOSPITAL_BASED_OUTPATIENT_CLINIC_OR_DEPARTMENT_OTHER): Payer: Self-pay

## 2021-04-27 ENCOUNTER — Encounter (HOSPITAL_BASED_OUTPATIENT_CLINIC_OR_DEPARTMENT_OTHER): Payer: Self-pay | Admitting: *Deleted

## 2021-04-27 DIAGNOSIS — Z760 Encounter for issue of repeat prescription: Secondary | ICD-10-CM | POA: Insufficient documentation

## 2021-04-27 DIAGNOSIS — L309 Dermatitis, unspecified: Secondary | ICD-10-CM | POA: Insufficient documentation

## 2021-04-27 MED ORDER — TRIAMCINOLONE ACETONIDE 0.1 % EX CREA
1.0000 "application " | TOPICAL_CREAM | Freq: Two times a day (BID) | CUTANEOUS | 0 refills | Status: DC
  Filled 2021-04-27: qty 30, 15d supply, fill #0

## 2021-04-27 NOTE — Discharge Instructions (Signed)
Return to ED with any new symptoms ?Please pick up your triamcinolone cream I prescribed ?Please read the attached informational guides concerning dyshidrotic eczema and eczema in general ?

## 2021-04-27 NOTE — ED Provider Notes (Signed)
?[facility] EMERGENCY DEPARTMENT ?Provider Note ? ? ?CSN: [id number] ?Arrival date & time: 04/27/21  1140 ? ?  ? ?History ? ?Chief Complaint  ?Patient presents with  ? Rash  ? ? ?Lauren Bray is a 26 y.o. female with history of eczema who presents to ED for medication refill/evaluation of rash. Patient states she was seen by dermatologist in August of 2021 and given triamcinolone cream. Patient noted to have multiple visits to ED requesting triamcinolone cream. Patient states her current eczema flare began 3 weeks ago, and is located to bilateral hands, arms. Patient continues she is having skin flares on chest, legs and breasts. Patient denies any systemic symptoms such as nausea, vomiting, diarrhea, fevers.  ? ? ?Rash ?Associated symptoms: no diarrhea, no fever, no nausea and not vomiting   ? ?  ? ?Home Medications ?Prior to Admission medications   ?Medication Sig Start Date End Date Taking? Authorizing Provider  ?triamcinolone cream (KENALOG) 0.1 % Apply 1 application topically 2 (two) times daily. 04/27/21  Yes Al Decant, PA-C  ?ALBUTEROL IN Inhale into the lungs.    [provider]  ?doxycycline (VIBRAMYCIN) 100 MG capsule Take 1 capsule (100 mg total) by mouth 2 (two) times daily. 12/02/18   Linwood Dibbles, MD  ?erythromycin ophthalmic ointment Place a 1/2 inch ribbon of ointment into the lower eyelid QID 12/02/18   Linwood Dibbles, MD  ?ibuprofen (ADVIL,MOTRIN) 600 MG tablet Take 1 tablet (600 mg total) by mouth every 8 (eight) hours as needed. 08/13/17   Azalia Bilis, MD  ?methocarbamol (ROBAXIN) 500 MG tablet Take 1 tablet (500 mg total) by mouth 2 (two) times daily. 08/07/19   Khatri, Hina, PA-C  ?naproxen (NAPROSYN) 500 MG tablet Take 1 tablet (500 mg total) by mouth 2 (two) times daily. 09/22/17   Horton, Mayer Masker, MD  ?   ? ?Allergies    ?Orange juice [orange oil]   ? ?Review of Systems   ?Review of Systems  ?Constitutional:  Negative for fever.  ?Gastrointestinal:  Negative for  diarrhea, nausea and vomiting.  ?Skin:  Positive for rash.  ?All other systems reviewed and are negative. ? ?Physical Exam ?Updated Vital Signs ?BP 132/77   Pulse 88   Temp 98.1 ?F (36.7 ?C) (Oral)   Resp 18   Ht 5\' 3"  (1.6 m)   Wt 99.8 kg   LMP 04/20/2021   SpO2 98%   BMI 38.97 kg/m?  ?Physical Exam ?Vitals and nursing note reviewed.  ?Constitutional:   ?   General: She is not in acute distress. ?   Appearance: Normal appearance. She is not ill-appearing, toxic-appearing or diaphoretic.  ?HENT:  ?   Head: Normocephalic and atraumatic.  ?   Nose: Nose normal. No congestion.  ?   Mouth/Throat:  ?   Mouth: Mucous membranes are moist.  ?Eyes:  ?   Extraocular Movements: Extraocular movements intact.  ?   Conjunctiva/sclera: Conjunctivae normal.  ?   Pupils: Pupils are equal, round, and reactive to light.  ?Cardiovascular:  ?   Rate and Rhythm: Normal rate and regular rhythm.  ?Pulmonary:  ?   Effort: Pulmonary effort is normal.  ?   Breath sounds: Normal breath sounds. No wheezing.  ?Abdominal:  ?   General: Abdomen is flat.  ?   Palpations: Abdomen is soft.  ?   Tenderness: There is no abdominal tenderness.  ?Musculoskeletal:  ?   Cervical back: Normal range of motion and neck supple. No tenderness.  ?  Skin: ?   General: Skin is warm and dry.  ?   Capillary Refill: Capillary refill takes less than 2 seconds.  ?   Findings: Rash present.  ?   Comments: Scattered areas of hyperpigmentation to wrists, hands, arms, chest, face, neck. She has scaly patches of skin to arms, chest, face. 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 Patient skin is excoriated due to scratching in bilateral wrists. No vesicles or petechia, no pustules.   ?Neurological:  ?   Mental Status: She is alert and oriented to person, place, and time.  ? ? ?ED Results / Procedures / Treatments   ?Labs ?(all labs ordered are listed, but only abnormal results are displayed) ?Labs Reviewed - No data to  display ? ?EKG ?None ? ?Radiology ?No results found. ? ?Procedures ?Procedures  ? ? ?Medications Ordered in ED ?Medications - No data to display ? ?ED Course/ Medical Decision Making/ A&P ?  ?                        ?Medical Decision Making ?Risk ?Prescription drug management. ? ? ?26 year old female presents to ED for refill of triamcinolone cream, states she is having eczema flare.  Please see HPI for further details. ? ?Patient noted to have hyperpigmentation to face.  I discussed with the patient that she is not to use the triamcinolone cream on her face for this reason.  The patient voices understanding of my directions. ? ?I have advised the patient to treat her symptoms utilizing antihistamines, avoiding hot showers as well as hydrating her skin utilizing Aquaphor or other lotions.  Patient reports that she has upcoming appointment at the end of this month with dermatology. ? ?At this time, patient stable for discharge.  Patient triamcinolone cream refilled, precautions given.  All of the patient's questions were answered to her satisfaction.  Patient stable at this time for discharge. ? ? ?Final Clinical Impression(s) / ED Diagnoses ?Final diagnoses:  ?Eczema, unspecified type  ? ? ?Rx / DC Orders ?ED Discharge Orders   ? ?      Ordered  ?  triamcinolone cream (KENALOG) 0.1 %  2 times daily       ? 04/27/21 1254  ? ?  ?  ? ?  ? ? ?  ?Al Decant, PA-C ?04/27/21 1259 ? ?  ?Vanetta Mulders, MD ?04/29/21 1822 ? ?

## 2021-04-27 NOTE — ED Triage Notes (Signed)
Hx of eczema. She ran out of her medication 3 months ago and the rash is out of control per pt. ?

## 2021-06-02 ENCOUNTER — Other Ambulatory Visit (HOSPITAL_BASED_OUTPATIENT_CLINIC_OR_DEPARTMENT_OTHER): Payer: Self-pay

## 2021-06-07 ENCOUNTER — Other Ambulatory Visit (HOSPITAL_BASED_OUTPATIENT_CLINIC_OR_DEPARTMENT_OTHER): Payer: Self-pay

## 2021-06-07 MED ORDER — TRIAMCINOLONE ACETONIDE 0.1 % EX CREA
1.0000 "application " | TOPICAL_CREAM | Freq: Two times a day (BID) | CUTANEOUS | 0 refills | Status: DC
  Filled 2021-06-07: qty 30, 15d supply, fill #0

## 2021-08-08 ENCOUNTER — Other Ambulatory Visit (HOSPITAL_BASED_OUTPATIENT_CLINIC_OR_DEPARTMENT_OTHER): Payer: Self-pay

## 2021-08-10 ENCOUNTER — Emergency Department (HOSPITAL_BASED_OUTPATIENT_CLINIC_OR_DEPARTMENT_OTHER)
Admission: EM | Admit: 2021-08-10 | Discharge: 2021-08-10 | Disposition: A | Discharge status: Home / Self Care | Payer: Medicaid Other | Attending: Emergency Medicine | Admitting: Emergency Medicine

## 2021-08-10 ENCOUNTER — Other Ambulatory Visit: Payer: Self-pay

## 2021-08-10 ENCOUNTER — Other Ambulatory Visit (HOSPITAL_BASED_OUTPATIENT_CLINIC_OR_DEPARTMENT_OTHER): Payer: Self-pay

## 2021-08-10 ENCOUNTER — Encounter (HOSPITAL_BASED_OUTPATIENT_CLINIC_OR_DEPARTMENT_OTHER): Payer: Self-pay

## 2021-08-10 DIAGNOSIS — L209 Atopic dermatitis, unspecified: Secondary | ICD-10-CM | POA: Insufficient documentation

## 2021-08-10 MED ORDER — TRIAMCINOLONE ACETONIDE 0.1 % EX OINT
1.0000 | TOPICAL_OINTMENT | Freq: Two times a day (BID) | CUTANEOUS | 0 refills | Status: DC
  Filled 2021-08-10: qty 454, 90d supply, fill #0

## 2021-08-10 MED ORDER — METHYLPREDNISOLONE 4 MG PO TBPK
ORAL_TABLET | ORAL | 0 refills | Status: AC
  Filled 2021-08-10: qty 21, 6d supply, fill #0

## 2021-08-10 NOTE — ED Triage Notes (Signed)
C/o eczema flare up x 3 weeks. Dermatology appt on 6/28 but couldn't wait that long. C/o generalized itching, ran out of cream.

## 2021-08-10 NOTE — ED Provider Notes (Signed)
[facility] EMERGENCY DEPARTMENT Provider Note   CSN: [id number] Arrival date & time: 08/10/21  1110     History  Chief Complaint  Patient presents with   Pruritis    Lauren Bray is a 26 y.o. female who presents emergency department complaining of an eczema flare.  Patient with known history of eczema, and has been having worsening symptoms for the past 3 weeks.  States that she has a dermatology appointment on 6/28, but felt like she could not wait that long.  She is complaining of generalized itching, ran of her previously prescribed creams.  No other complaints.  HPI     Home Medications Prior to Admission medications   Medication Sig Start Date End Date Taking? Authorizing Provider  methylPREDNISolone (MEDROL DOSEPAK) 4 MG TBPK tablet Take 6 tablets by mouth on day 1, then take 5 tablets on day 2, then 4 tablets on day 3, then 3 tablets on day 4, then 2 tablets on day 5, then 1 tablet on day 6. 08/10/21  Yes Lealer Marsland T, PA-C  triamcinolone ointment (KENALOG) 0.1 % Apply 1 Application on to the skin 2 (two) times daily. 08/10/21  Yes Fischer Halley T, PA-C  ALBUTEROL IN Inhale into the lungs.    [provider]  doxycycline (VIBRAMYCIN) 100 MG capsule Take 1 capsule (100 mg total) by mouth 2 (two) times daily. 12/02/18   Linwood Dibbles, MD  erythromycin ophthalmic ointment Place a 1/2 inch ribbon of ointment into the lower eyelid QID 12/02/18   Linwood Dibbles, MD  ibuprofen (ADVIL,MOTRIN) 600 MG tablet Take 1 tablet (600 mg total) by mouth every 8 (eight) hours as needed. 08/13/17   Azalia Bilis, MD  methocarbamol (ROBAXIN) 500 MG tablet Take 1 tablet (500 mg total) by mouth 2 (two) times daily. 08/07/19   Khatri, Hina, PA-C  naproxen (NAPROSYN) 500 MG tablet Take 1 tablet (500 mg total) by mouth 2 (two) times daily. 09/22/17   Horton, Mayer Masker, MD      Allergies    Orange juice Erskine Emery oil]    Review of Systems   Review of Systems  Skin:  Positive for  rash.  All other systems reviewed and are negative.   Physical Exam Updated Vital Signs BP 128/74   Pulse 78   Temp 98.6 F (37 C) (Oral)   Resp 16   Ht 5\' 3"  (1.6 m)   Wt 106.6 kg   LMP 08/02/2021 (Approximate)   SpO2 99%   BMI 41.63 kg/m  Physical Exam Vitals and nursing note reviewed.  Constitutional:      Appearance: Normal appearance.  HENT:     Head: Normocephalic and atraumatic.  Eyes:     Conjunctiva/sclera: Conjunctivae normal.  Pulmonary:     Effort: Pulmonary effort is normal. No respiratory distress.  Skin:    General: Skin is warm and dry.     Comments: Diffuse eczematous rash to torso, axilla, neck, and back.  No overlying erythema or signs of infection  Neurological:     Mental Status: She is alert.  Psychiatric:        Mood and Affect: Mood normal.        Behavior: Behavior normal.     ED Results / Procedures / Treatments   Labs (all labs ordered are listed, but only abnormal results are displayed) Labs Reviewed - No data to display  EKG None  Radiology No results found.  Procedures Procedures    Medications Ordered in ED  Medications - No data to display  ED Course/ Medical Decision Making/ A&P                           Medical Decision Making Risk Prescription drug management.  This patient is a 26 y.o. female who presents to the ED for concern of eczema flare.   Past Medical History / Social History / Additional history: Chart reviewed. Pertinent results include: Patient been seen multiple times for similar symptoms in the past.  Most recently on 3/2, discharged with prescription for triamcinolone cream.  Physical Exam: Physical exam performed. The pertinent findings include: Diffuse eczematous rash to torso and extremities.  Does not appear infected.   Disposition: After consideration of the diagnostic results and the patients response to treatment, I feel that patient's not requiring admission.  Patient states that she does  better with the triamcinolone ointment versus cream, will prescribe this as well as a steroid taper.  She will follow-up with dermatology later this month as previously scheduled.. Discussed reasons to return to the emergency department, and the patient is agreeable to the plan.   Final Clinical Impression(s) / ED Diagnoses Final diagnoses:  Atopic dermatitis, unspecified type    Rx / DC Orders ED Discharge Orders          Ordered    triamcinolone ointment (KENALOG) 0.1 %  2 times daily        08/10/21 1358    methylPREDNISolone (MEDROL DOSEPAK) 4 MG TBPK tablet        08/10/21 1358           Portions of this report may have been transcribed using voice recognition software. Every effort was made to ensure accuracy; however, inadvertent computerized transcription errors may be present.    Jeanella Flattery 08/10/21 1409    Derwood Kaplan, MD 08/11/21 [phone]

## 2021-08-10 NOTE — Discharge Instructions (Addendum)
You were seen in the emergency department today for eczema flare.  As we discussed have prescribed you not only the triamcinolone ointment, but as well as a short course of steroids.  This follows a taper, so please follow the package instructions for how many to take.  Good luck at your dermatology appointment!

## 2021-08-21 ENCOUNTER — Other Ambulatory Visit (HOSPITAL_BASED_OUTPATIENT_CLINIC_OR_DEPARTMENT_OTHER): Payer: Self-pay

## 2021-08-23 ENCOUNTER — Other Ambulatory Visit (HOSPITAL_COMMUNITY): Payer: Self-pay

## 2021-08-23 MED ORDER — TRIAMCINOLONE ACETONIDE 0.1 % EX OINT
TOPICAL_OINTMENT | Freq: Two times a day (BID) | CUTANEOUS | 1 refills | Status: DC
  Filled 2021-08-23 – 2022-06-20 (×2): qty 454, 30d supply, fill #0

## 2021-08-23 MED ORDER — HYDROXYZINE HCL 25 MG PO TABS
25.0000 mg | ORAL_TABLET | Freq: Every day | ORAL | 0 refills | Status: AC
  Filled 2021-08-23: qty 60, 30d supply, fill #0

## 2021-09-01 ENCOUNTER — Other Ambulatory Visit (HOSPITAL_COMMUNITY): Payer: Self-pay
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
## 2022-06-20 ENCOUNTER — Other Ambulatory Visit (HOSPITAL_BASED_OUTPATIENT_CLINIC_OR_DEPARTMENT_OTHER): Payer: Self-pay

## 2022-10-17 ENCOUNTER — Other Ambulatory Visit (HOSPITAL_BASED_OUTPATIENT_CLINIC_OR_DEPARTMENT_OTHER): Payer: Self-pay

## 2022-10-17 ENCOUNTER — Other Ambulatory Visit: Payer: Self-pay

## 2022-10-18 ENCOUNTER — Emergency Department
Admission: EM | Admit: 2022-10-18 | Discharge: 2022-10-18 | Discharge status: Against Medical Advice | Payer: Medicaid Other

## 2022-10-18 ENCOUNTER — Ambulatory Visit
Admission: EM | Admit: 2022-10-18 | Discharge: 2022-10-18 | Disposition: A | Discharge status: Home / Self Care | Payer: Self-pay | Attending: Emergency Medicine | Admitting: Emergency Medicine

## 2022-10-18 DIAGNOSIS — N898 Other specified noninflammatory disorders of vagina: Secondary | ICD-10-CM | POA: Insufficient documentation

## 2022-10-18 MED ORDER — FLUCONAZOLE 150 MG PO TABS: 150.0000 mg | ORAL_TABLET | Freq: Every day | ORAL | 0 refills | Status: AC

## 2022-10-18 MED ORDER — MICONAZOLE NITRATE 2 % EX CREA: 1.0000 | TOPICAL_CREAM | Freq: Two times a day (BID) | CUTANEOUS | 0 refills | Status: AC

## 2022-10-18 NOTE — Discharge Instructions (Addendum)
Today you are being treated prophylactically for yeast.   Take diflucan 150 mg once, if symptoms still present in 3 days then you may take second pill   May use cream twice a day as needed for comfort  Yeast infections which are caused by a naturally occurring fungus called candida. Vaginosis is an inflammation of the vagina that can result in discharge, itching and pain. The cause is usually a change in the normal balance of vaginal bacteria or an infection. Vaginosis can also result from reduced estrogen levels after menopause.  Labs pending , you will be contacted if positive for any sti and treatment will be sent to the pharmacy, you will have to return to the clinic if positive for gonorrhea to receive treatment   Please refrain from having sex until labs results, if positive please refrain from having sex until treatment complete and symptoms resolve   If positive for  Chlamydia  gonorrhea or trichomoniasis please notify partner or partners so they may tested as well  Moving forward, it is recommended you use some form of protection against the transmission of sti infections  such as condoms or dental dams with each sexual encounter    In addition:   Avoid baths, hot tubs and whirlpool spas.  Don't use scented or harsh soaps, such as those with deodorant or antibacterial action. Avoid irritants. These include scented tampons and pads. Wipe from front to back after using the toilet.  Don't douche. Your vagina doesn't require cleansing other than normal bathing.  Use a  condom. Wear cotton underwear, this fabric helps absorb moisture

## 2022-10-18 NOTE — ED Triage Notes (Addendum)
Patient to Urgent Care with complaints of vaginal itching and burning. States the outside of her vagina is red and raw. Denies any discharge.  Symptoms started two days ago. Denies any concerns for STDs. Applied some cetaphil ointment last night with no relief.

## 2022-10-18 NOTE — ED Notes (Signed)
This RN informed by unit secretary that patient left to go to [facility]. This RN did not see this patient leave.

## 2022-10-18 NOTE — ED Provider Notes (Signed)
[name]    CSN: [id number] Arrival date & time: 10/18/22  1443      History   Chief Complaint Chief Complaint  Patient presents with   Vaginal Itching    HPI Lauren Bray is a 27 y.o. female.   Patient presents for evaluation of vaginal itching and irritation, odor present for 2 days.  Endorses the external labia feel raw and pain is elicited with movement and with urination.  Has attempted use of Cetaphil which has been ineffective.  Denies current sexual activity, no concern for STD.  Denies urinary symptoms, new rash or lesions, abdominal pain, flank pain, vaginal discharge.   Past Medical History:  Diagnosis Date   Asthma    Eczema     There are no problems to display for this patient.   History reviewed. No pertinent surgical history.  OB History   No obstetric history on file.      Home Medications    Prior to Admission medications   Medication Sig Start Date End Date Taking? Authorizing Provider  fluconazole (DIFLUCAN) 150 MG tablet Take 1 tablet (150 mg total) by mouth daily for 2 doses. 10/18/22 10/20/22 Yes Brian Kocourek R, NP  miconazole (MICOTIN) 2 % cream Apply 1 Application topically 2 (two) times daily. 10/18/22  Yes Kendric Sindelar, Elita Boone, NP  ALBUTEROL IN Inhale into the lungs.    [provider]  doxycycline (VIBRAMYCIN) 100 MG capsule Take 1 capsule (100 mg total) by mouth 2 (two) times daily. 12/02/18   Linwood Dibbles, MD  erythromycin ophthalmic ointment Place a 1/2 inch ribbon of ointment into the lower eyelid QID 12/02/18   Linwood Dibbles, MD  hydrOXYzine (ATARAX) 25 MG tablet Take 1-2 tablets (25-50 mg total) by mouth daily. 08/23/21     ibuprofen (ADVIL,MOTRIN) 600 MG tablet Take 1 tablet (600 mg total) by mouth every 8 (eight) hours as needed. 08/13/17   Azalia Bilis, MD  methocarbamol (ROBAXIN) 500 MG tablet Take 1 tablet (500 mg total) by mouth 2 (two) times daily. 08/07/19   Khatri, Hina, PA-C  methylPREDNISolone (MEDROL DOSEPAK) 4  MG TBPK tablet Take 6 tablets by mouth on day 1, then take 5 tablets on day 2, then 4 tablets on day 3, then 3 tablets on day 4, then 2 tablets on day 5, then 1 tablet on day 6. 08/10/21   Roemhildt, Lorin T, PA-C  naproxen (NAPROSYN) 500 MG tablet Take 1 tablet (500 mg total) by mouth 2 (two) times daily. 09/22/17   Horton, Mayer Masker, MD  triamcinolone ointment (KENALOG) 0.1 % Apply 1 application topically 2 (two) times daily. 08/23/21       Family History History reviewed. No pertinent family history.  Social History Social History   Tobacco Use   Smoking status: Every Day    Current packs/day: 0.00    Types: Cigars, Cigarettes    Last attempt to quit: 05/07/2019    Years since quitting: 3.4   Smokeless tobacco: Never  Vaping Use   Vaping status: Never Used  Substance Use Topics   Alcohol use: No   Drug use: No     Allergies   Orange juice [orange oil]   Review of Systems Review of Systems   Physical Exam Triage Vital Signs ED Triage Vitals  Encounter Vitals Group     BP 10/18/22 1512 127/84     Systolic BP Percentile --      Diastolic BP Percentile --      Pulse  Rate 10/18/22 1512 (!) 104     Resp 10/18/22 1512 18     Temp 10/18/22 1512 98.2 F (36.8 C)     Temp Source 10/18/22 1512 Oral     SpO2 10/18/22 1512 98 %     Weight --      Height --      Head Circumference --      Peak Flow --      Pain Score 10/18/22 1507 10     Pain Loc --      Pain Education --      Exclude from Growth Chart --    No data found.  Updated Vital Signs BP 127/84 (BP Location: Left Arm)   Pulse (!) 104   Temp 98.2 F (36.8 C) (Oral)   Resp 18   SpO2 98%   Visual Acuity Right Eye Distance:   Left Eye Distance:   Bilateral Distance:    Right Eye Near:   Left Eye Near:    Bilateral Near:     Physical Exam Constitutional:      Appearance: Normal appearance.  Eyes:     Extraocular Movements: Extraocular movements intact.  Pulmonary:     Effort: Pulmonary effort is  normal.  Genitourinary:    Comments: deferred Neurological:     Mental Status: She is alert and oriented to person, place, and time. Mental status is at baseline.      UC Treatments / Results  Labs (all labs ordered are listed, but only abnormal results are displayed) Labs Reviewed  CERVICOVAGINAL ANCILLARY ONLY    EKG   Radiology No results found.  Procedures Procedures (including critical care time)  Medications Ordered in UC Medications - No data to display  Initial Impression / Assessment and Plan / UC Course  I have reviewed the triage vital signs and the nursing notes.  Pertinent labs & imaging results that were available during my care of the patient were reviewed by me and considered in my medical decision making (see chart for details).  Vaginal itching  Treating prophylactically for yeast, Diflucan and miconazole cream sent to pharmacy and discussed administration, STI labs are pending, will treat further per protocol, advised abstinence until lab results treatment is complete and all symptoms have resolved, given written handout of additional supportive measures, advised follow-up with symptoms persist worsen or recur Final Clinical Impressions(s) / UC Diagnoses   Final diagnoses:  Vaginal itching     Discharge Instructions      Today you are being treated prophylactically for yeast.   Take diflucan 150 mg once, if symptoms still present in 3 days then you may take second pill   May use cream twice a day as needed for comfort  Yeast infections which are caused by a naturally occurring fungus called candida. Vaginosis is an inflammation of the vagina that can result in discharge, itching and pain. The cause is usually a change in the normal balance of vaginal bacteria or an infection. Vaginosis can also result from reduced estrogen levels after menopause.  Labs pending , you will be contacted if positive for any sti and treatment will be sent to the  pharmacy, you will have to return to the clinic if positive for gonorrhea to receive treatment   Please refrain from having sex until labs results, if positive please refrain from having sex until treatment complete and symptoms resolve   If positive for  Chlamydia  gonorrhea or trichomoniasis please notify partner or partners so  they may tested as well  Moving forward, it is recommended you use some form of protection against the transmission of sti infections  such as condoms or dental dams with each sexual encounter    In addition:   Avoid baths, hot tubs and whirlpool spas.  Don't use scented or harsh soaps, such as those with deodorant or antibacterial action. Avoid irritants. These include scented tampons and pads. Wipe from front to back after using the toilet.  Don't douche. Your vagina doesn't require cleansing other than normal bathing.  Use a  condom. Wear cotton underwear, this fabric helps absorb moisture     ED Prescriptions     Medication Sig Dispense Auth. Provider   miconazole (MICOTIN) 2 % cream Apply 1 Application topically 2 (two) times daily. 28.35 g Salli Quarry R, NP   fluconazole (DIFLUCAN) 150 MG tablet Take 1 tablet (150 mg total) by mouth daily for 2 doses. 2 tablet Valinda Hoar, NP      PDMP not reviewed this encounter.   Valinda Hoar, NP 10/18/22 [phone]

## 2022-10-19 LAB — CERVICOVAGINAL ANCILLARY ONLY
Bacterial Vaginitis (gardnerella): POSITIVE — AB
Candida Glabrata: NEGATIVE
Candida Vaginitis: NEGATIVE
Chlamydia: NEGATIVE
Comment: NEGATIVE
Comment: NEGATIVE
Comment: NEGATIVE
Comment: NEGATIVE
Comment: NEGATIVE
Comment: NORMAL
Neisseria Gonorrhea: NEGATIVE
Trichomonas: NEGATIVE

## 2022-10-22 ENCOUNTER — Other Ambulatory Visit (HOSPITAL_BASED_OUTPATIENT_CLINIC_OR_DEPARTMENT_OTHER): Payer: Self-pay

## 2022-10-22 ENCOUNTER — Telehealth: Payer: Self-pay

## 2022-10-22 MED ORDER — METRONIDAZOLE 500 MG PO TABS: 500.0000 mg | ORAL_TABLET | Freq: Two times a day (BID) | ORAL | 0 refills | Status: AC

## 2022-10-22 NOTE — Telephone Encounter (Signed)
Per protocol, pt requires tx with metronidazole. Reviewed with patient, verified pharmacy, prescription sent.

## 2022-10-25 ENCOUNTER — Telehealth: Payer: Self-pay | Admitting: Physician Assistant

## 2022-10-25 DIAGNOSIS — L309 Dermatitis, unspecified: Secondary | ICD-10-CM

## 2022-10-25 NOTE — Progress Notes (Signed)
Because of severity of symptoms and not responding to prescription steroid creams, I feel your condition warrants further evaluation and I recommend that you be seen in a face to face visit.   NOTE: There will be NO CHARGE for this eVisit   If you are having a true medical emergency please call 911.      For an urgent face to face visit, [facility] has eight urgent care centers for your convenience:   NEW!! [facility] Center at [facility] Get Driving Directions [phone] [address] [place], [address]    [facility] Health Urgent Care Center at [facility] Get Driving Directions [phone] [address] [place], [place] [address]   [facility] Health Urgent Care Center [facility]) Get Driving Directions [phone] [address] [place], [place] [address]  [facility] Health Urgent Care Center [facility]) Get Driving Directions [phone] [address] [place],  [place]  [address]  [facility] Health Urgent Care Center [facility] - at [employer]  [phone] [address] [place],  [place] [address]   [facility] Health Urgent Care at [facility] Get Driving Directions [phone] [address] [place], [place] [address]   [facility] Health Urgent Care at [facility] Get Driving Directions  [phone] [address] [place], [place] [address]   [facility] Health Urgent Care at [facility] Directions [phone] [address], [address] [place], [place] [address]  Your MyChart E-visit questionnaire answers were reviewed by a board certified advanced clinical practitioner to complete your personal care plan based on your specific symptoms.  Thank you for using e-Visits.

## 2022-10-26 ENCOUNTER — Ambulatory Visit
Admission: EM | Admit: 2022-10-26 | Discharge: 2022-10-26 | Disposition: A | Discharge status: Home / Self Care | Payer: Self-pay | Attending: Emergency Medicine | Admitting: Emergency Medicine

## 2022-10-26 ENCOUNTER — Other Ambulatory Visit: Payer: Self-pay

## 2022-10-26 DIAGNOSIS — L209 Atopic dermatitis, unspecified: Secondary | ICD-10-CM

## 2022-10-26 MED ORDER — TRIAMCINOLONE ACETONIDE 0.1 % EX CREA: 1.0000 | TOPICAL_CREAM | Freq: Two times a day (BID) | CUTANEOUS | 2 refills | Status: AC

## 2022-10-26 MED ORDER — PREDNISONE 20 MG PO TABS: 40.0000 mg | ORAL_TABLET | Freq: Every day | ORAL | 0 refills | Status: AC

## 2022-10-26 MED ORDER — TRIAMCINOLONE ACETONIDE 0.1 % EX CREA: 1.0000 | TOPICAL_CREAM | Freq: Two times a day (BID) | CUTANEOUS | 2 refills | Status: DC

## 2022-10-26 MED ORDER — PREDNISONE 20 MG PO TABS: 40.0000 mg | ORAL_TABLET | Freq: Every day | ORAL | 0 refills | Status: DC

## 2022-10-26 NOTE — ED Provider Notes (Signed)
Lauren Bray    CSN: [id number] Arrival date & time: 10/26/22  1041      History   Chief Complaint Chief Complaint  Patient presents with   Eczema    HPI Lauren Bray is a 27 y.o. female.   Patient presents for evaluation of a flare of her chronic eczema rash present to the bilateral hands, feet, lower extremities and back for 4 days.  Rash is pruritic.  Denies drainage or presence of fever.  Endorses that she stopped use of triamcinolone ointment 3 weeks ago.  Not currently followed by dermatology, has attempted use of Dupixent in the past which she did not see improvement in symptoms with.  Denies recent change in toiletries, diet or recent travel.  Using Aveeno as emollient.  Past Medical History:  Diagnosis Date   Asthma    Eczema     There are no problems to display for this patient.   History reviewed. No pertinent surgical history.  OB History   No obstetric history on file.      Home Medications    Prior to Admission medications   Medication Sig Start Date End Date Taking? Authorizing Provider  miconazole (MICOTIN) 2 % cream Apply 1 Application topically 2 (two) times daily. 10/18/22  Yes Peja Allender R, NP  predniSONE (DELTASONE) 20 MG tablet Take 2 tablets (40 mg total) by mouth daily. 10/26/22  Yes Ndea Kilroy R, NP  triamcinolone cream (KENALOG) 0.1 % Apply 1 Application topically 2 (two) times daily. 10/26/22  Yes Glorianne Proctor, Elita Boone, NP  ALBUTEROL IN Inhale into the lungs.    [provider]  doxycycline (VIBRAMYCIN) 100 MG capsule Take 1 capsule (100 mg total) by mouth 2 (two) times daily. 12/02/18   Linwood Dibbles, MD  erythromycin ophthalmic ointment Place a 1/2 inch ribbon of ointment into the lower eyelid QID 12/02/18   Linwood Dibbles, MD  hydrOXYzine (ATARAX) 25 MG tablet Take 1-2 tablets (25-50 mg total) by mouth daily. 08/23/21     ibuprofen (ADVIL,MOTRIN) 600 MG tablet Take 1 tablet (600 mg total) by mouth every 8 (eight) hours as  needed. 08/13/17   Azalia Bilis, MD  methocarbamol (ROBAXIN) 500 MG tablet Take 1 tablet (500 mg total) by mouth 2 (two) times daily. 08/07/19   Khatri, Hina, PA-C  methylPREDNISolone (MEDROL DOSEPAK) 4 MG TBPK tablet Take 6 tablets by mouth on day 1, then take 5 tablets on day 2, then 4 tablets on day 3, then 3 tablets on day 4, then 2 tablets on day 5, then 1 tablet on day 6. 08/10/21   Roemhildt, Lorin T, PA-C  metroNIDAZOLE (FLAGYL) 500 MG tablet Take 1 tablet (500 mg total) by mouth 2 (two) times daily for 7 days. 10/22/22 10/29/22  Merrilee Jansky, MD  naproxen (NAPROSYN) 500 MG tablet Take 1 tablet (500 mg total) by mouth 2 (two) times daily. 09/22/17   Horton, Mayer Masker, MD    Family History History reviewed. No pertinent family history.  Social History Social History   Tobacco Use   Smoking status: Every Day    Current packs/day: 0.00    Types: Cigars, Cigarettes    Last attempt to quit: 05/07/2019    Years since quitting: 3.4   Smokeless tobacco: Never  Vaping Use   Vaping status: Never Used  Substance Use Topics   Alcohol use: No   Drug use: No     Allergies   Orange juice [orange oil]   Review of  Systems Review of Systems  Constitutional: Negative.   HENT: Negative.    Respiratory: Negative.    Cardiovascular: Negative.   Skin:  Positive for rash. Negative for color change, pallor and wound.     Physical Exam Triage Vital Signs ED Triage Vitals  Encounter Vitals Group     BP 10/26/22 1055 124/78     Systolic BP Percentile --      Diastolic BP Percentile --      Pulse Rate 10/26/22 1055 94     Resp 10/26/22 1055 18     Temp 10/26/22 1055 98.8 F (37.1 C)     Temp src --      SpO2 10/26/22 1055 100 %     Weight --      Height --      Head Circumference --      Peak Flow --      Pain Score 10/26/22 1050 0     Pain Loc --      Pain Education --      Exclude from Growth Chart --    No data found.  Updated Vital Signs BP 124/78   Pulse 94   Temp  98.8 F (37.1 C)   Resp 18   LMP 10/05/2022   SpO2 100%   Visual Acuity Right Eye Distance:   Left Eye Distance:   Bilateral Distance:    Right Eye Near:   Left Eye Near:    Bilateral Near:     Physical Exam Constitutional:      Appearance: Normal appearance.  Eyes:     Extraocular Movements: Extraocular movements intact.  Pulmonary:     Effort: Pulmonary effort is normal.  Skin:    Comments: Severe eczematous rash present over the bilateral upper and lower extremities, trunk of the body and face, hypopigmentation present, excoriations with openings to the skin present over the left ankle, no drainage noted  Neurological:     Mental Status: She is alert and oriented to person, place, and time. Mental status is at baseline.      UC Treatments / Results  Labs (all labs ordered are listed, but only abnormal results are displayed) Labs Reviewed - No data to display  EKG   Radiology No results found.  Procedures Procedures (including critical care time)  Medications Ordered in UC Medications - No data to display  Initial Impression / Assessment and Plan / UC Course  I have reviewed the triage vital signs and the nursing notes.  Pertinent labs & imaging results that were available during my care of the patient were reviewed by me and considered in my medical decision making (see chart for details).  Atopic dermatitis  Presentation consistent with above diagnoses, no current signs of infection however rash appears to be severe, prescribed oral prednisone burst as well as triamcinolone cream, advised to follow-up with her dermatologist for further evaluation and management, recommended continue use of emollient and advised against long exposure to water to prevent further irritation, use of daily antihistamine to help reduce flares, advised dermatology follow-up for further management Final Clinical Impressions(s) / UC Diagnoses   Final diagnoses:  Atopic dermatitis,  unspecified type     Discharge Instructions      Today you were evaluated for your eczema ,which at this time appears to be severe  Begin oral prednisone every morning with food for 5 days to help reduce inflammation which ideally should help to calm symptoms  You may apply topical steroid  cream twice daily, you have been given 2 refills on medication  Please reach out to dermatologist for further evaluation and management  As we move into allergy season please begin oral antihistamine as allergies typically will worsen eczema symptoms  Continue to moisturize skin daily with Aveeno as dryness will worsen symptoms  Avoid long exposure to water such as during bathing, limit bathing and showers to 10 to 15 minutes as over exposure to water will worsen symptoms  May follow-up with urgent care as needed   ED Prescriptions     Medication Sig Dispense Auth. Provider   predniSONE (DELTASONE) 20 MG tablet Take 2 tablets (40 mg total) by mouth daily. 10 tablet Gabrial Domine R, NP   triamcinolone cream (KENALOG) 0.1 % Apply 1 Application topically 2 (two) times daily. 454 g Valinda Hoar, NP      PDMP not reviewed this encounter.   Valinda Hoar, NP 10/26/22 1120

## 2022-10-26 NOTE — Discharge Instructions (Signed)
Today you were evaluated for your eczema ,which at this time appears to be severe  Begin oral prednisone every morning with food for 5 days to help reduce inflammation which ideally should help to calm symptoms  You may apply topical steroid cream twice daily, you have been given 2 refills on medication  Please reach out to dermatologist for further evaluation and management  As we move into allergy season please begin oral antihistamine as allergies typically will worsen eczema symptoms  Continue to moisturize skin daily with Aveeno as dryness will worsen symptoms  Avoid long exposure to water such as during bathing, limit bathing and showers to 10 to 15 minutes as over exposure to water will worsen symptoms  May follow-up with urgent care as needed

## 2022-10-26 NOTE — ED Triage Notes (Signed)
Pt c/o eczema flare up pt is experiencing itching, flaking and irritation.

## 2023-01-11 ENCOUNTER — Ambulatory Visit
Admission: RE | Admit: 2023-01-11 | Discharge: 2023-01-11 | Disposition: A | Discharge status: Home / Self Care | Payer: Self-pay | Source: Ambulatory Visit | Attending: Emergency Medicine | Admitting: Emergency Medicine

## 2023-01-11 VITALS — BP 138/78 | HR 100 | Temp 98.5°F | Resp 16

## 2023-01-11 DIAGNOSIS — N898 Other specified noninflammatory disorders of vagina: Secondary | ICD-10-CM

## 2023-01-11 DIAGNOSIS — N3000 Acute cystitis without hematuria: Secondary | ICD-10-CM

## 2023-01-11 LAB — POCT URINALYSIS DIP (MANUAL ENTRY)
Glucose, UA: NEGATIVE mg/dL
Nitrite, UA: NEGATIVE
Protein Ur, POC: >=300 mg/dL — AB
Spec Grav, UA: >=1.03 — AB (ref 1.010–1.025)
Urobilinogen, UA: 0.2 U/dL
pH, UA: 5.5 (ref 5.0–8.0)

## 2023-01-11 LAB — POCT URINE PREGNANCY: Preg Test, Ur: NEGATIVE

## 2023-01-11 MED ORDER — CEPHALEXIN 500 MG PO CAPS: 500.0000 mg | ORAL_CAPSULE | Freq: Two times a day (BID) | ORAL | 0 refills | Status: AC

## 2023-01-11 NOTE — Discharge Instructions (Addendum)
Take the antibiotic as directed.  The urine culture is pending.  We will call you if it shows the need to change or discontinue your antibiotic.    Your vaginal tests are pending.  If your test results are positive, we will call you.    Follow up with your primary care provider if your symptoms are not improving.

## 2023-01-11 NOTE — ED Provider Notes (Signed)
Lauren Bray    CSN: [id number] Arrival date & time: 01/11/23  1359      History   Chief Complaint Chief Complaint  Patient presents with   Urinary Frequency    I have a UTI - Entered by patient    HPI Lauren Bray is a 27 y.o. female.  Patient presents with dysuria, urinary frequency, bladder pressure x 1 week.  She also reports some vaginal discharge and discomfort.  She denies concern for STDs but would like to be tested for BV and yeast.  She has been treating her symptoms with cranberry pills.  No fever, abdominal pain, flank pain, pelvic pain, or other symptoms.  The history is provided by the patient and medical records.    Past Medical History:  Diagnosis Date   Asthma    Eczema     There are no problems to display for this patient.   History reviewed. No pertinent surgical history.  OB History   No obstetric history on file.      Home Medications    Prior to Admission medications   Medication Sig Start Date End Date Taking? Authorizing Provider  cephALEXin (KEFLEX) 500 MG capsule Take 1 capsule (500 mg total) by mouth 2 (two) times daily for 5 days. 01/11/23 01/16/23 Yes Mickie Bail, NP  ALBUTEROL IN Inhale into the lungs.    [provider]  erythromycin ophthalmic ointment Place a 1/2 inch ribbon of ointment into the lower eyelid QID Patient not taking: Reported on 01/11/2023 12/02/18   Linwood Dibbles, MD  hydrOXYzine (ATARAX) 25 MG tablet Take 1-2 tablets (25-50 mg total) by mouth daily. 08/23/21     ibuprofen (ADVIL,MOTRIN) 600 MG tablet Take 1 tablet (600 mg total) by mouth every 8 (eight) hours as needed. 08/13/17   Azalia Bilis, MD  methocarbamol (ROBAXIN) 500 MG tablet Take 1 tablet (500 mg total) by mouth 2 (two) times daily. Patient not taking: Reported on 01/11/2023 08/07/19   Dietrich Pates, PA-C  methylPREDNISolone (MEDROL DOSEPAK) 4 MG TBPK tablet Take 6 tablets by mouth on day 1, then take 5 tablets on day 2, then 4 tablets on  day 3, then 3 tablets on day 4, then 2 tablets on day 5, then 1 tablet on day 6. Patient not taking: Reported on 01/11/2023 08/10/21   Roemhildt, Lorin T, PA-C  miconazole (MICOTIN) 2 % cream Apply 1 Application topically 2 (two) times daily. 10/18/22   White, Elita Boone, NP  naproxen (NAPROSYN) 500 MG tablet Take 1 tablet (500 mg total) by mouth 2 (two) times daily. 09/22/17   Horton, Mayer Masker, MD  predniSONE (DELTASONE) 20 MG tablet Take 2 tablets (40 mg total) by mouth daily. Patient not taking: Reported on 01/11/2023 10/26/22   Valinda Hoar, NP  triamcinolone cream (KENALOG) 0.1 % Apply 1 Application topically 2 (two) times daily. 10/26/22   Valinda Hoar, NP    Family History History reviewed. No pertinent family history.  Social History Social History   Tobacco Use   Smoking status: Every Day    Current packs/day: 0.00    Types: Cigars, Cigarettes    Last attempt to quit: 05/07/2019    Years since quitting: 3.6   Smokeless tobacco: Never  Vaping Use   Vaping status: Never Used  Substance Use Topics   Alcohol use: No   Drug use: No     Allergies   Orange juice [orange oil]   Review of Systems Review of Systems  Constitutional:  Negative for chills and fever.  Gastrointestinal:  Negative for abdominal pain, blood in stool, constipation, diarrhea, nausea and vomiting.  Genitourinary:  Positive for dysuria, frequency and vaginal discharge. Negative for flank pain, hematuria and pelvic pain.     Physical Exam Triage Vital Signs ED Triage Vitals  Encounter Vitals Group     BP      Systolic BP Percentile      Diastolic BP Percentile      Pulse      Resp      Temp      Temp src      SpO2      Weight      Height      Head Circumference      Peak Flow      Pain Score      Pain Loc      Pain Education      Exclude from Growth Chart    No data found.  Updated Vital Signs BP 138/78   Pulse 100   Temp 98.5 F (36.9 C)   Resp 16   LMP 12/15/2022    SpO2 97%   Visual Acuity Right Eye Distance:   Left Eye Distance:   Bilateral Distance:    Right Eye Near:   Left Eye Near:    Bilateral Near:     Physical Exam Constitutional:      General: She is not in acute distress. HENT:     Mouth/Throat:     Mouth: Mucous membranes are moist.  Cardiovascular:     Rate and Rhythm: Normal rate and regular rhythm.  Pulmonary:     Effort: Pulmonary effort is normal. No respiratory distress.  Abdominal:     General: Bowel sounds are normal.     Palpations: Abdomen is soft.     Tenderness: There is no abdominal tenderness. There is no right CVA tenderness, left CVA tenderness, guarding or rebound.  Skin:    General: Skin is warm and dry.  Neurological:     Mental Status: She is alert.      UC Treatments / Results  Labs (all labs ordered are listed, but only abnormal results are displayed) Labs Reviewed  POCT URINALYSIS DIP (MANUAL ENTRY) - Abnormal; Notable for the following components:      Result Value   Clarity, UA cloudy (*)    Bilirubin, UA small (*)    Ketones, POC UA small (15) (*)    Spec Grav, UA >=1.030 (*)    Blood, UA large (*)    Protein Ur, POC >=300 (*)    Leukocytes, UA Small (1+) (*)    All other components within normal limits  URINE CULTURE  POCT URINE PREGNANCY  CERVICOVAGINAL ANCILLARY ONLY    EKG   Radiology No results found.  Procedures Procedures (including critical care time)  Medications Ordered in UC Medications - No data to display  Initial Impression / Assessment and Plan / UC Course  I have reviewed the triage vital signs and the nursing notes.  Pertinent labs & imaging results that were available during my care of the patient were reviewed by me and considered in my medical decision making (see chart for details).    Acute cystitis without hematuria, vaginal discharge.  Treating with Keflex. Urine culture pending. Discussed with patient that we will call her if the urine culture  shows the need to change or discontinue the antibiotic. Patient obtained self swab for testing.  Discussed  that we will call if test results are positive.  She reports no concern for STDs.  Instructed her to follow-up with her PCP or gynecologist if her symptoms are not improving.  Patient agrees to plan of care.      Final Clinical Impressions(s) / UC Diagnoses   Final diagnoses:  Acute cystitis without hematuria  Vaginal discharge     Discharge Instructions      Take the antibiotic as directed.  The urine culture is pending.  We will call you if it shows the need to change or discontinue your antibiotic.    Your vaginal tests are pending.  If your test results are positive, we will call you.   Follow-up with your primary care provider if your symptoms are not improving.          ED Prescriptions     Medication Sig Dispense Auth. Provider   cephALEXin (KEFLEX) 500 MG capsule Take 1 capsule (500 mg total) by mouth 2 (two) times daily for 5 days. 10 capsule Mickie Bail, NP      PDMP not reviewed this encounter.   Mickie Bail, NP 01/11/23 [phone]

## 2023-01-11 NOTE — ED Triage Notes (Signed)
Patient to Urgent Care with complaints of urinary frequency and dysuria x1 week. Denies any fevers.   Has been pushing fluids/ cranberry/ cranberry pills.

## 2023-01-13 LAB — URINE CULTURE: Culture: 80000 — AB

## 2023-01-14 ENCOUNTER — Telehealth (HOSPITAL_COMMUNITY): Payer: Self-pay | Admitting: Emergency Medicine

## 2023-01-14 LAB — CERVICOVAGINAL ANCILLARY ONLY
Bacterial Vaginitis (gardnerella): POSITIVE — AB
Candida Glabrata: NEGATIVE
Candida Vaginitis: NEGATIVE
Comment: NEGATIVE
Comment: NEGATIVE
Comment: NEGATIVE

## 2023-01-14 MED ORDER — METRONIDAZOLE 500 MG PO TABS: 500.0000 mg | ORAL_TABLET | Freq: Two times a day (BID) | ORAL | 0 refills | Status: AC

## 2023-01-14 NOTE — Telephone Encounter (Signed)
Metronidazole for positive BV

## 2024-01-09 ENCOUNTER — Telehealth: Payer: Self-pay | Admitting: Family Medicine

## 2024-01-09 DIAGNOSIS — L309 Dermatitis, unspecified: Secondary | ICD-10-CM

## 2024-01-09 MED ORDER — TRIAMCINOLONE ACETONIDE 0.1 % EX CREA: 1.0000 | TOPICAL_CREAM | Freq: Two times a day (BID) | CUTANEOUS | 0 refills | Status: AC

## 2024-01-09 NOTE — Progress Notes (Signed)
